# Patient Record
Sex: Male | Born: 1985 | Race: Black or African American | Hispanic: No | Marital: Married | Smoking: Light tobacco smoker
Health system: Southern US, Community
[De-identification: ages and names within clinical notes are randomized; demographics above are authoritative.]

## PROBLEM LIST (undated history)

## (undated) ENCOUNTER — Ambulatory Visit: Admission: EM | Disposition: A | Discharge status: Home / Self Care | Payer: BC Managed Care – PPO

## (undated) DIAGNOSIS — D869 Sarcoidosis, unspecified: Secondary | ICD-10-CM

## (undated) DIAGNOSIS — N200 Calculus of kidney: Secondary | ICD-10-CM

---
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
## 2004-08-29 ENCOUNTER — Emergency Department (HOSPITAL_COMMUNITY): Admission: EM | Admit: 2004-08-29 | Discharge: 2004-08-29 | Payer: Self-pay | Admitting: Emergency Medicine

## 2005-01-06 ENCOUNTER — Emergency Department (HOSPITAL_COMMUNITY): Admission: EM | Admit: 2005-01-06 | Discharge: 2005-01-06 | Payer: Self-pay | Admitting: Emergency Medicine

## 2005-02-21 ENCOUNTER — Emergency Department (HOSPITAL_COMMUNITY): Admission: EM | Admit: 2005-02-21 | Discharge: 2005-02-21 | Payer: Self-pay | Admitting: Emergency Medicine

## 2006-03-02 ENCOUNTER — Emergency Department (HOSPITAL_COMMUNITY): Admission: EM | Admit: 2006-03-02 | Discharge: 2006-03-02 | Payer: Self-pay | Admitting: Emergency Medicine

## 2006-05-19 ENCOUNTER — Emergency Department (HOSPITAL_COMMUNITY): Admission: EM | Admit: 2006-05-19 | Discharge: 2006-05-20 | Payer: Self-pay | Admitting: Family Medicine

## 2006-05-19 ENCOUNTER — Emergency Department (HOSPITAL_COMMUNITY): Admission: EM | Admit: 2006-05-19 | Discharge: 2006-05-19 | Payer: Self-pay | Admitting: Emergency Medicine

## 2006-08-11 ENCOUNTER — Emergency Department (HOSPITAL_COMMUNITY): Admission: EM | Admit: 2006-08-11 | Discharge: 2006-08-12 | Payer: Self-pay | Admitting: Emergency Medicine

## 2007-02-26 ENCOUNTER — Emergency Department (HOSPITAL_COMMUNITY): Admission: EM | Admit: 2007-02-26 | Discharge: 2007-02-26 | Payer: Self-pay | Admitting: Emergency Medicine

## 2007-11-21 ENCOUNTER — Emergency Department (HOSPITAL_COMMUNITY): Admission: EM | Admit: 2007-11-21 | Discharge: 2007-11-21 | Payer: Self-pay | Admitting: Emergency Medicine

## 2007-11-27 ENCOUNTER — Ambulatory Visit: Payer: Self-pay | Admitting: Pulmonary Disease

## 2007-11-27 DIAGNOSIS — G43909 Migraine, unspecified, not intractable, without status migrainosus: Secondary | ICD-10-CM | POA: Insufficient documentation

## 2007-11-27 DIAGNOSIS — J984 Other disorders of lung: Secondary | ICD-10-CM | POA: Insufficient documentation

## 2007-11-27 DIAGNOSIS — R599 Enlarged lymph nodes, unspecified: Secondary | ICD-10-CM | POA: Insufficient documentation

## 2007-11-27 LAB — CONVERTED CEMR LAB
Albumin: 3.4 g/dL — ABNORMAL LOW (ref 3.5–5.2)
Alkaline Phosphatase: 71 units/L (ref 39–117)
Bilirubin, Direct: 0.1 mg/dL (ref 0.0–0.3)
INR: 1 (ref 0.8–1.0)
LDH: 411 units/L — ABNORMAL HIGH (ref 94–250)
Prothrombin Time: 12 s (ref 10.9–13.3)
Rhuematoid fact SerPl-aCnc: <20 intl units/mL — ABNORMAL LOW (ref 0.0–20.0)
Total Protein: 7.9 g/dL (ref 6.0–8.3)
aPTT: 34.6 s — ABNORMAL HIGH (ref 21.7–29.8)

## 2007-12-04 ENCOUNTER — Ambulatory Visit: Admission: RE | Admit: 2007-12-04 | Discharge: 2007-12-04 | Payer: Self-pay | Admitting: Pulmonary Disease

## 2007-12-04 ENCOUNTER — Ambulatory Visit: Payer: Self-pay | Admitting: Pulmonary Disease

## 2007-12-04 ENCOUNTER — Encounter: Payer: Self-pay | Admitting: Pulmonary Disease

## 2007-12-10 ENCOUNTER — Ambulatory Visit: Payer: Self-pay | Admitting: Pulmonary Disease

## 2007-12-10 DIAGNOSIS — D869 Sarcoidosis, unspecified: Secondary | ICD-10-CM | POA: Insufficient documentation

## 2007-12-13 ENCOUNTER — Ambulatory Visit: Payer: Self-pay | Admitting: Pulmonary Disease

## 2007-12-14 ENCOUNTER — Ambulatory Visit: Payer: Self-pay | Admitting: Cardiology

## 2008-01-09 ENCOUNTER — Ambulatory Visit: Payer: Self-pay | Admitting: Pulmonary Disease

## 2008-03-11 ENCOUNTER — Ambulatory Visit: Payer: Self-pay | Admitting: Pulmonary Disease

## 2009-08-17 ENCOUNTER — Emergency Department (HOSPITAL_COMMUNITY): Admission: EM | Admit: 2009-08-17 | Discharge: 2009-08-17 | Payer: Self-pay | Admitting: Emergency Medicine

## 2010-08-09 ENCOUNTER — Emergency Department (HOSPITAL_COMMUNITY)
Admission: EM | Admit: 2010-08-09 | Discharge: 2010-08-09 | Payer: Self-pay | Source: Home / Self Care | Admitting: Emergency Medicine

## 2010-08-25 ENCOUNTER — Emergency Department (HOSPITAL_COMMUNITY)
Admission: EM | Admit: 2010-08-25 | Discharge: 2010-08-25 | Payer: Self-pay | Source: Home / Self Care | Admitting: Emergency Medicine

## 2010-08-26 ENCOUNTER — Emergency Department (HOSPITAL_COMMUNITY)
Admission: EM | Admit: 2010-08-26 | Discharge: 2010-08-26 | Payer: Self-pay | Source: Home / Self Care | Admitting: Emergency Medicine

## 2010-11-08 LAB — DIFFERENTIAL
Basophils Absolute: 0 10*3/uL (ref 0.0–0.1)
Eosinophils Relative: 0 % (ref 0–5)
Lymphocytes Relative: 8 % — ABNORMAL LOW (ref 12–46)
Lymphs Abs: 1.2 10*3/uL (ref 0.7–4.0)
Monocytes Absolute: 2 10*3/uL — ABNORMAL HIGH (ref 0.1–1.0)
Monocytes Relative: 14 % — ABNORMAL HIGH (ref 3–12)
Neutro Abs: 11.7 10*3/uL — ABNORMAL HIGH (ref 1.7–7.7)

## 2010-11-08 LAB — CBC
HCT: 45.3 % (ref 39.0–52.0)
Hemoglobin: 15.2 g/dL (ref 13.0–17.0)
MCV: 86 fL (ref 78.0–100.0)
RDW: 12.8 % (ref 11.5–15.5)
WBC: 15 10*3/uL — ABNORMAL HIGH (ref 4.0–10.5)

## 2010-11-08 LAB — STREP A DNA PROBE: Group A Strep Probe: NEGATIVE

## 2010-11-08 LAB — POCT I-STAT, CHEM 8
BUN: 8 mg/dL (ref 6–23)
Calcium, Ion: 1.02 mmol/L — ABNORMAL LOW (ref 1.12–1.32)
Chloride: 109 mEq/L (ref 96–112)
Creatinine, Ser: 1.2 mg/dL (ref 0.4–1.5)
Glucose, Bld: 95 mg/dL (ref 70–99)
TCO2: 23 mmol/L (ref 0–100)

## 2010-11-08 LAB — RAPID STREP SCREEN (MED CTR MEBANE ONLY): Streptococcus, Group A Screen (Direct): NEGATIVE

## 2011-01-11 NOTE — Op Note (Signed)
NAMEJAKOBIE, Steve Fuller             ACCOUNT NO.:  [id number]   MEDICAL RECORD NO.:  [id number]          PATIENT TYPE:  AMB   LOCATION:  CARD                         FACILITY:  [facility]   PHYSICIAN:  Oretha Milch, MD      DATE OF BIRTH:  07-02-1986   DATE OF PROCEDURE:  12/04/2007  DATE OF DISCHARGE:                               OPERATIVE REPORT   PROCEDURE PERFORMED:  Fluoroscopy.   INDICATIONS FOR PROCEDURE:  Mediastinal lymphadenopathy and pulmonary  nodules in this 25 year old smoker with normal ACE level and elevated  LDH.  The differential diagnosis being sarcoidosis or lymphoma.  The  risks of the procedure were discussed including that of coughing,  bleeding and a small chance of lung puncture requiring a chest tube were  discussed with the patient in great detail.  The relative yields of the  procedure and the possible need for a second procedure was also  discussed in the office in detail.   ANESTHESIA:  Versed 6 mg and Fentanyl 150 mcg were used in divided doses  during the procedure with blood pressure and cardiac monitoring.   DESCRIPTION OF PROCEDURE:  The bronchoscope was inserted from the right  nare.  Upper airway appeared normal.  Vocal cord showed normal  appearance and motion.  A tiny endobronchial nodule was noted in the  superior segment of the right lower lobe bronchus.  All other segmental  and subsegmental airways appeared intact.  The bronchial mucosa appeared  mildly inflamed.   Under fluoroscopy, transbronchial biopsies were obtained from the right  lower lobe. The nodule in the superior segment of the right lower lobe  was brushed.  Wang needle aspirate was obtained in the carina.  The  right upper lobe bronchus showed four subsegmental bronchi which could  be an anatomical variant.  Bronchoalveolar lavage was obtained from the  right lower lobe.   Patient tolerated the procedure well and was awake and interactive right  after the procedure.  A chest  x-ray will be performed to rule out  presence of pneumothorax.      Oretha Milch, MD  Electronically Signed     RVA/MEDQ  D:  12/04/2007  T:  12/04/2007  Job:  [id number]

## 2011-03-12 IMAGING — CT CT NECK W/ CM
3 series · 13 of 20 positions shown, 15 images · IV contrast (agent unspecified)
Comparison: None.

CLINICAL DATA: Throat pain for 2 weeks.

CT NECK WITH CONTRAST
TECHNIQUE: Multidetector CT imaging of the neck was performed with
intravenous contrast.
Contrast: 100 ml Rmnipaque-5CC.

[Series 2: neck rtn st · axial · 0.49mm/px · z∈[-296,-104]mm · 5 of 98 slices shown]
[im 17/98  bone]
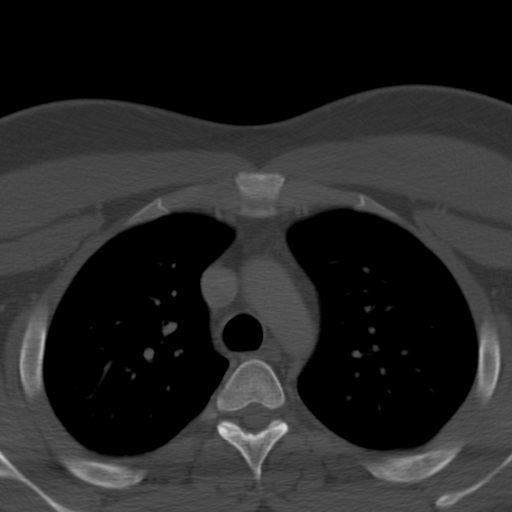
[im 33/98  bone]
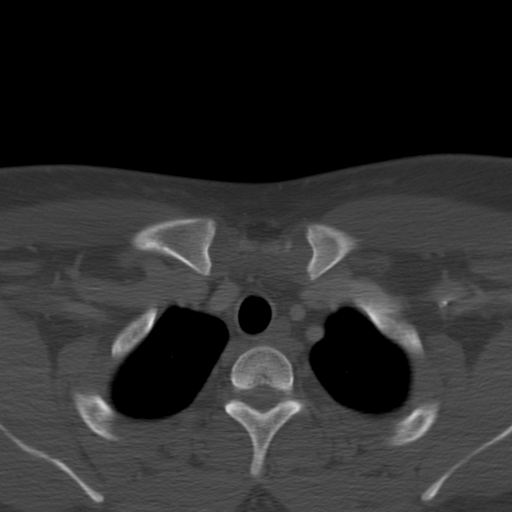
[im 49/98  bone]
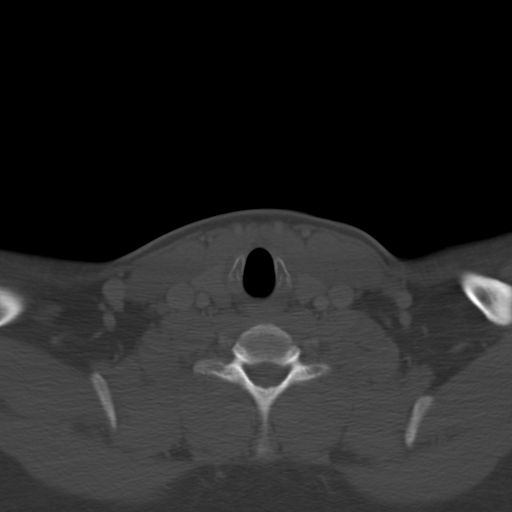
[im 65/98  bone]
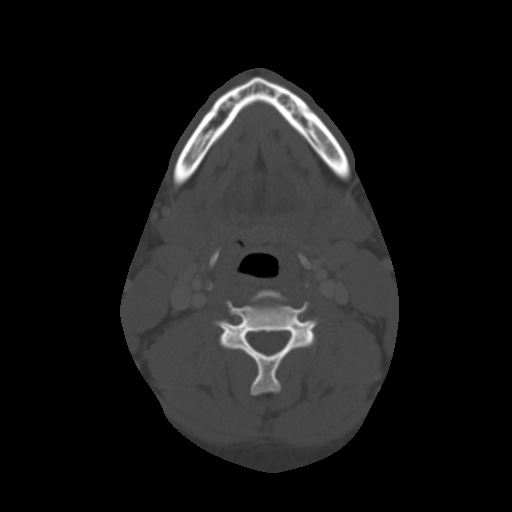
[im 81/98  bone]
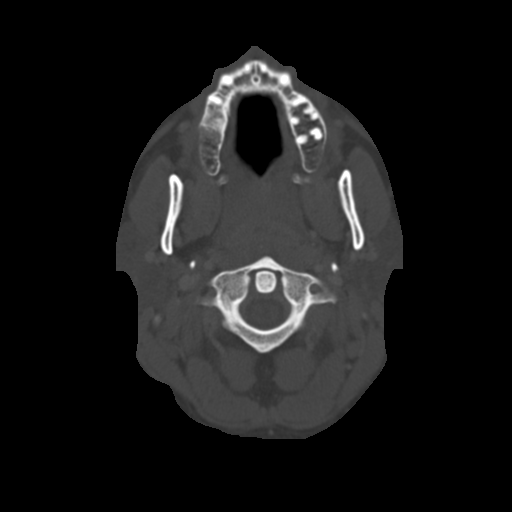

[Series 604: <mpr thick range(2)> · coronal · 0.58mm/px · 3 of 55 slices shown]
[im 11/55  bone]
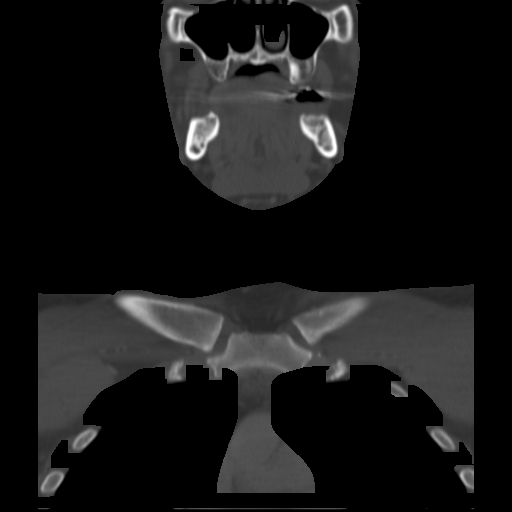
[im 22/55  bone]
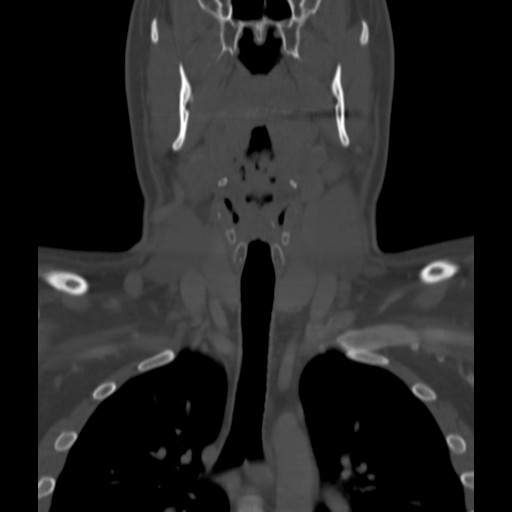
[im 33/55  bone]
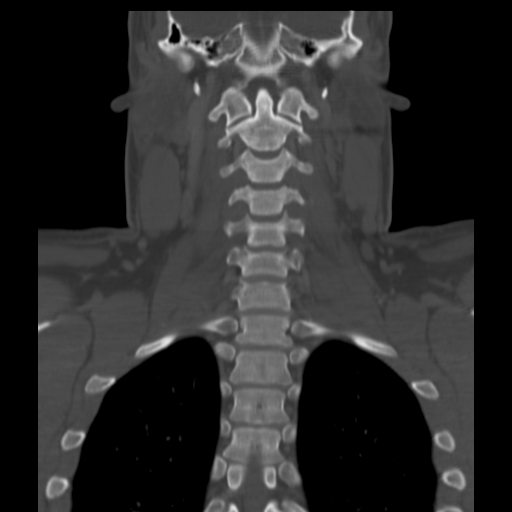

[Series 605: <mpr thick range(3)> · axial · 0.58mm/px · z∈[-337,-131]mm · 5 of 105 slices shown, 7 images]
[im 18/105  soft-tissue]
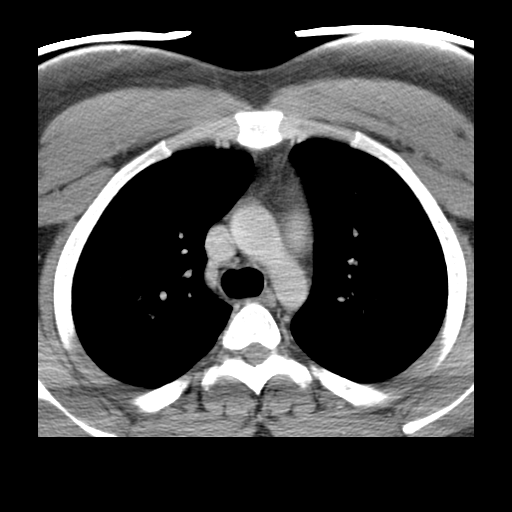
[im 18/105  bone]
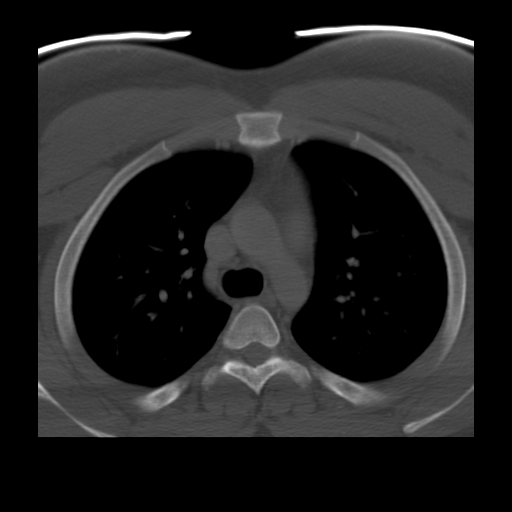
[im 35/105  bone]
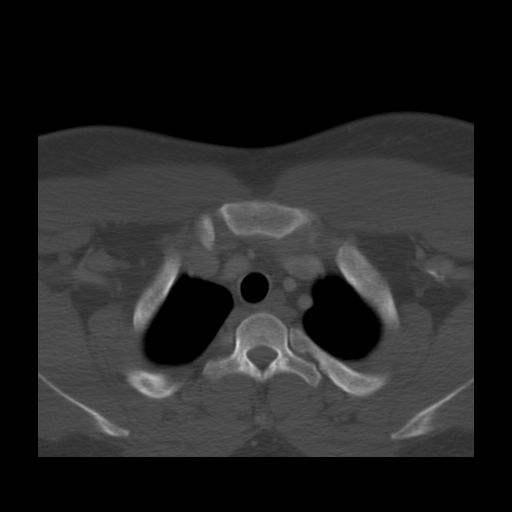
[im 53/105  bone]
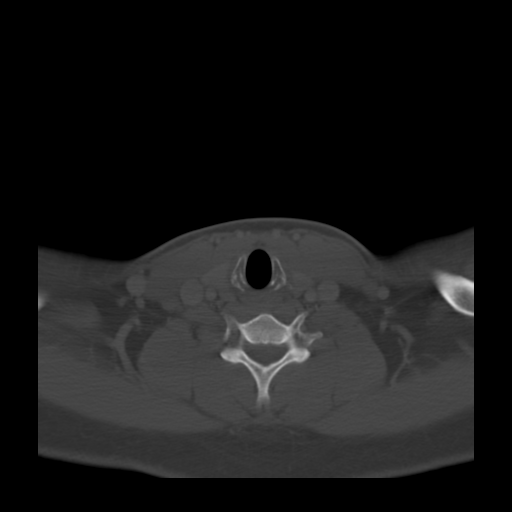
[im 70/105  bone]
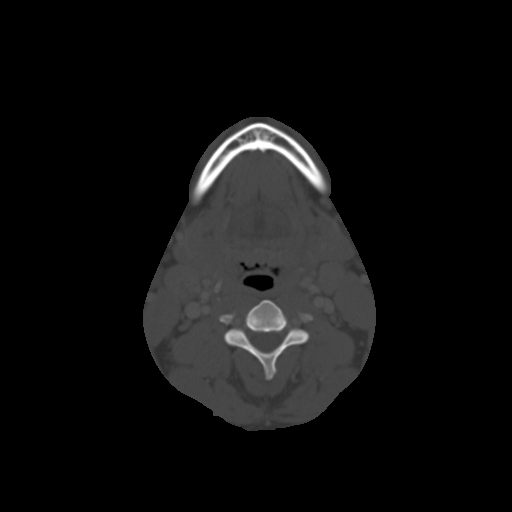
[im 87/105  soft-tissue]
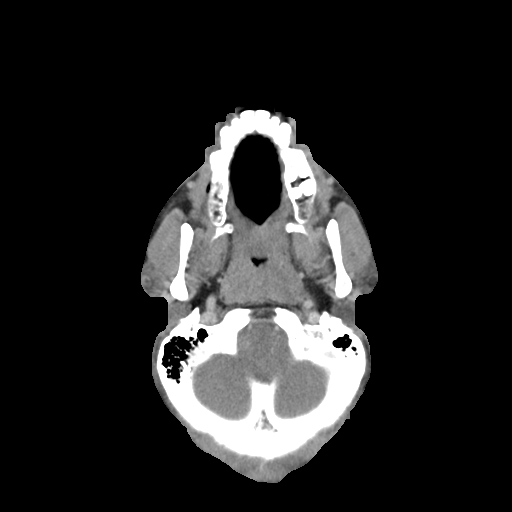
[im 87/105  bone]
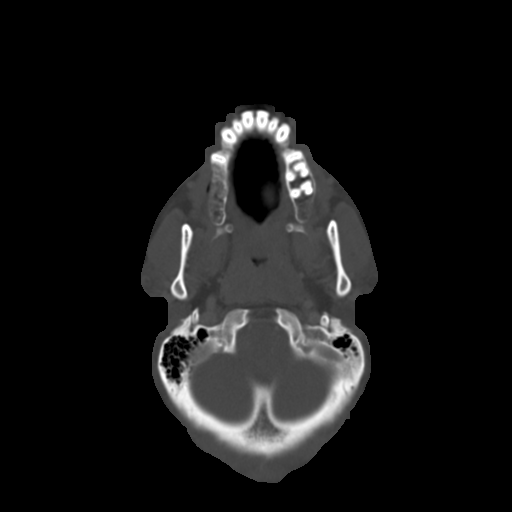

[13 of 20 positions shown; findings below may reference images not displayed]

FINDINGS: Diffuse tonsillar hypertrophy throughout Waldeyers ring.
Within the enlarged palatine tonsils, there are low density areas
suggestive of small abscesses.  On the right abscess measures up to
11.8 x 6.4 x 8.7 mm.  On the left there are a few small abscesses
side-by-side measuring up to 20.1 x 11.8 x 10.6 mm.  Presently, no
breakthrough into the parapharyngeal space.

Adenopathy most prominent level II region measuring 2.4 x 2 x
cm on the right and 2 x 1.3 x 3.8 cm on the left.

Cerebellar tonsils minimally low-lying but within range normal
limits.  Congenitally narrowed cervical canal.  Exophthalmos.
IMPRESSION: Diffuse tonsillar hypertrophy throughout Waldeyers ring.  Within
the enlarged palatine tonsils, there are low density areas
suggestive of small abscesses.  Presently, no breakthrough into the
parapharyngeal space.

Level II adenopathy as described above.

Critical test results telephoned to Dr.  Ozoria at the time of

## 2011-05-23 LAB — CBC
HCT: 42.2
Hemoglobin: 14.4
MCHC: 34.1
MCV: 83.9
Platelets: 382
RBC: 5.02
RDW: 12.5
WBC: 10.3

## 2011-05-23 LAB — BASIC METABOLIC PANEL
BUN: 5 — ABNORMAL LOW
CO2: 26
Calcium: 9.1
Chloride: 102
Creatinine, Ser: 0.98
GFR calc Af Amer: >60
GFR calc non Af Amer: >60
Glucose, Bld: 78
Potassium: 3.9
Sodium: 136

## 2011-05-23 LAB — DIFFERENTIAL
Eosinophils Relative: 1
Lymphocytes Relative: 15
Lymphs Abs: 1.5
Monocytes Relative: 13 — ABNORMAL HIGH
Neutrophils Relative %: 70

## 2011-05-24 LAB — CULTURE, RESPIRATORY W GRAM STAIN: Culture: NORMAL

## 2011-05-24 LAB — AFB CULTURE WITH SMEAR (NOT AT ARMC)

## 2011-05-24 LAB — FUNGUS CULTURE W SMEAR: Fungal Smear: NONE SEEN

## 2012-10-22 ENCOUNTER — Encounter (HOSPITAL_COMMUNITY): Payer: Self-pay | Admitting: Emergency Medicine

## 2012-10-22 ENCOUNTER — Emergency Department (HOSPITAL_COMMUNITY)
Admission: EM | Admit: 2012-10-22 | Discharge: 2012-10-22 | Disposition: A | Discharge status: Home / Self Care | Payer: Self-pay | Attending: Emergency Medicine | Admitting: Emergency Medicine

## 2012-10-22 DIAGNOSIS — Z8619 Personal history of other infectious and parasitic diseases: Secondary | ICD-10-CM | POA: Insufficient documentation

## 2012-10-22 DIAGNOSIS — Z79899 Other long term (current) drug therapy: Secondary | ICD-10-CM | POA: Insufficient documentation

## 2012-10-22 DIAGNOSIS — F17200 Nicotine dependence, unspecified, uncomplicated: Secondary | ICD-10-CM | POA: Insufficient documentation

## 2012-10-22 DIAGNOSIS — R509 Fever, unspecified: Secondary | ICD-10-CM | POA: Insufficient documentation

## 2012-10-22 DIAGNOSIS — J358 Other chronic diseases of tonsils and adenoids: Secondary | ICD-10-CM | POA: Insufficient documentation

## 2012-10-22 DIAGNOSIS — J039 Acute tonsillitis, unspecified: Secondary | ICD-10-CM

## 2012-10-22 HISTORY — DX: Sarcoidosis, unspecified: D86.9

## 2012-10-22 MED ORDER — PREDNISONE 20 MG PO TABS: ORAL_TABLET | ORAL | Status: DC

## 2012-10-22 MED ORDER — CEPHALEXIN 500 MG PO CAPS: ORAL_CAPSULE | ORAL | Status: DC

## 2012-10-22 MED ORDER — OXYCODONE-ACETAMINOPHEN 5-325 MG PO TABS: 2.0000 | ORAL_TABLET | ORAL | Status: DC | PRN

## 2012-10-22 NOTE — ED Notes (Signed)
Pt here for sore throat sore x3 days with fever of 1o1 with increase pain today

## 2012-10-22 NOTE — ED Provider Notes (Signed)
History     CSN: [id number]  Arrival date & time 10/22/12  0825   First MD Initiated Contact with Patient 10/22/12 [phone]      Chief Complaint  Patient presents with  . Sore Throat    (Consider location/radiation/quality/duration/timing/severity/associated sxs/prior treatment) HPI This 27 year old male is 3 days of sore throat with fever with no other symptoms, he has no drooling no stridor no trismus, has no nasal congestion no cough no chest pain no shortness breath no abdominal pain no vomiting no diarrhea no rash no confusion. He is able to take oral fluids. There is no treatment prior to arrival other than over-the-counter analgesics with minimal improvement. Past Medical History  Diagnosis Date  . Sarcoidosis     History reviewed. No pertinent past surgical history.  History reviewed. No pertinent family history.  History  Substance Use Topics  . Smoking status: Light Tobacco Smoker -- 0.50 packs/day  . Smokeless tobacco: Not on file  . Alcohol Use: No      Review of Systems 10 Systems reviewed and are negative for acute change except as noted in the HPI. Allergies  Penicillins  Home Medications   Current Outpatient Rx  Name  Route  Sig  Dispense  Refill  . acetaminophen (TYLENOL) 500 MG tablet   Oral   Take 500 mg by mouth every 6 (six) hours as needed for pain.         Marland Kitchen ibuprofen (ADVIL,MOTRIN) 200 MG tablet   Oral   Take 200 mg by mouth every 6 (six) hours as needed for pain.         . cephALEXin (KEFLEX) 500 MG capsule      2 caps po bid x 7 days   28 capsule   0   . oxyCODONE-acetaminophen (PERCOCET) 5-325 MG per tablet   Oral   Take 2 tablets by mouth every 4 (four) hours as needed for pain.   20 tablet   0   . predniSONE (DELTASONE) 20 MG tablet      3 tabs po daily x 2 days   6 tablet   0     BP 128/82  Pulse 98  Temp(Src) 99.5 F (37.5 C) (Oral)  Resp 18  SpO2 98%  Physical Exam  Nursing note and vitals  reviewed. Constitutional:  Awake, alert, nontoxic appearance.  HENT:  Head: Atraumatic.  Mouth/Throat: Oropharyngeal exudate present.  Bilateral tonsillar exudates with erythema and swelling with midline uvula without trismus without suspicion of peritonsillar abscess or airway compromise  Eyes: Right eye exhibits no discharge. Left eye exhibits no discharge.  Neck: Neck supple.  Cardiovascular: Normal rate and regular rhythm.   No murmur heard. Pulmonary/Chest: Effort normal and breath sounds normal. No stridor. No respiratory distress. He has no wheezes. He has no rales. He exhibits no tenderness.  Abdominal: Soft. Bowel sounds are normal. He exhibits no distension. There is no tenderness. There is no rebound and no guarding.  No hepatosplenomegaly appreciated  Musculoskeletal: He exhibits no edema and no tenderness.  Baseline ROM, no obvious new focal weakness.  Lymphadenopathy:    He has cervical adenopathy.  Neurological: He is alert.  Mental status and motor strength appears baseline for patient and situation.  Skin: No rash noted.  Psychiatric: He has a normal mood and affect.    ED Course  Procedures (including critical care time)  Labs Reviewed - No data to display No results found.   1. Tonsillitis with exudate  MDM   Pt stable in ED with no significant deterioration in condition.  Patient / Family / Caregiver informed of clinical course, understand medical decision-making process, and agree with plan.  I doubt any other EMC precluding discharge at this time including, but not necessarily limited to the following:PTA.       Hurman Horn, MD 10/29/12 [phone]

## 2013-07-29 ENCOUNTER — Emergency Department (HOSPITAL_COMMUNITY)
Admission: EM | Admit: 2013-07-29 | Discharge: 2013-07-29 | Disposition: A | Discharge status: Home / Self Care | Payer: Self-pay | Attending: Emergency Medicine | Admitting: Emergency Medicine

## 2013-07-29 ENCOUNTER — Encounter (HOSPITAL_COMMUNITY): Payer: Self-pay | Admitting: Emergency Medicine

## 2013-07-29 DIAGNOSIS — F17200 Nicotine dependence, unspecified, uncomplicated: Secondary | ICD-10-CM | POA: Insufficient documentation

## 2013-07-29 DIAGNOSIS — J039 Acute tonsillitis, unspecified: Secondary | ICD-10-CM | POA: Insufficient documentation

## 2013-07-29 DIAGNOSIS — Z8619 Personal history of other infectious and parasitic diseases: Secondary | ICD-10-CM | POA: Insufficient documentation

## 2013-07-29 DIAGNOSIS — Z79899 Other long term (current) drug therapy: Secondary | ICD-10-CM | POA: Insufficient documentation

## 2013-07-29 MED ORDER — AZITHROMYCIN 250 MG PO TABS: 500.0000 mg | ORAL_TABLET | Freq: Every day | ORAL | Status: DC

## 2013-07-29 MED ORDER — PREDNISONE 20 MG PO TABS: 40.0000 mg | ORAL_TABLET | Freq: Every day | ORAL | Status: DC

## 2013-07-29 NOTE — ED Notes (Signed)
Pt states he has swelling in his throat from tonsils. Pt states he has hx of tonsillitis and needs tonsils out.

## 2013-07-29 NOTE — ED Notes (Signed)
Md at bedside

## 2013-07-29 NOTE — ED Notes (Signed)
Pt states he knows he was supposed to have his tonsils taken out 4 or 5 years ago, but he hasn't.  Pt presents today with c/o neck swelling similar to when he has a flare up with his tonsils.

## 2013-07-29 NOTE — ED Provider Notes (Signed)
CSN: [id number]     Arrival date & time 07/29/13  1543 History   First MD Initiated Contact with Patient 07/29/13 1945     Chief Complaint  Patient presents with  . Sore Throat   (Consider location/radiation/quality/duration/timing/severity/associated sxs/prior Treatment) HPI Comments: Patient comes to the ER for evaluation of sore throat. Patient reports that he has a history of recurrent tonsillitis, was told that he needs his tonsils out. He gets frequent episodes of tonsillitis. Last night he started having pain in his throat with swallowing and this has progressed. He has not had any fever. There is no trouble swallowing or trouble breathing. No nausea, vomiting, diarrhea. He has swelling of his glands in his neck.  Patient is a 27 y.o. male presenting with pharyngitis.  Sore Throat    Past Medical History  Diagnosis Date  . Sarcoidosis    History reviewed. No pertinent past surgical history. No family history on file. History  Substance Use Topics  . Smoking status: Light Tobacco Smoker -- 0.50 packs/day  . Smokeless tobacco: Not on file  . Alcohol Use: No    Review of Systems  HENT: Positive for sore throat.   Skin: Negative for rash.    Allergies  Amoxicillin and Food  Home Medications   Current Outpatient Rx  Name  Route  Sig  Dispense  Refill  . guaiFENesin (MUCINEX) 600 MG 12 hr tablet   Oral   Take 600 mg by mouth 2 (two) times daily.         . Multiple Vitamin (MULTIVITAMIN WITH MINERALS) TABS tablet   Oral   Take 1 tablet by mouth daily.          BP 146/89  Pulse 84  Temp(Src) 97.7 F (36.5 C) (Oral)  Resp 20  Ht 6\' 2"  (1.88 m)  Wt 255 lb (115.667 kg)  BMI 32.73 kg/m2  SpO2 100% Physical Exam  Constitutional: He is oriented to person, place, and time. He appears well-developed and well-nourished. No distress.  HENT:  Head: Normocephalic and atraumatic.  Right Ear: Hearing normal.  Left Ear: Hearing normal.  Nose: Nose normal.   Mouth/Throat: Mucous membranes are normal. Posterior oropharyngeal erythema present. No tonsillar abscesses.  Eyes: Conjunctivae and EOM are normal. Pupils are equal, round, and reactive to light.  Neck: Normal range of motion. Neck supple.  Cardiovascular: Regular rhythm, S1 normal and S2 normal.  Exam reveals no gallop and no friction rub.   No murmur heard. Pulmonary/Chest: Effort normal and breath sounds normal. No respiratory distress. He exhibits no tenderness.  Abdominal: Soft. Normal appearance and bowel sounds are normal. There is no hepatosplenomegaly. There is no tenderness. There is no rebound, no guarding, no tenderness at McBurney's point and negative Murphy's sign. No hernia.  Musculoskeletal: Normal range of motion.  Lymphadenopathy:    He has cervical adenopathy.       Right cervical: Superficial cervical adenopathy present.  Neurological: He is alert and oriented to person, place, and time. He has normal strength. No cranial nerve deficit or sensory deficit. Coordination normal. GCS eye subscore is 4. GCS verbal subscore is 5. GCS motor subscore is 6.  Skin: Skin is warm, dry and intact. No rash noted. No cyanosis.  Psychiatric: He has a normal mood and affect. His speech is normal and behavior is normal. Thought content normal.    ED Course  Procedures (including critical care time) Labs Review Labs Reviewed - No data to display Imaging Review No results found.  EKG Interpretation   None       MDM  Diagnosis: Pharyngitis/tonsillitis  Presents to the ER with recurrent pharyngitis/tonsillitis. Treat Empirically.    Gilda Crease, MD 07/29/13 2018

## 2019-06-22 ENCOUNTER — Emergency Department (HOSPITAL_COMMUNITY): Payer: Self-pay

## 2019-06-22 ENCOUNTER — Observation Stay (HOSPITAL_COMMUNITY): Payer: Self-pay

## 2019-06-22 ENCOUNTER — Other Ambulatory Visit: Payer: Self-pay

## 2019-06-22 ENCOUNTER — Inpatient Hospital Stay (HOSPITAL_COMMUNITY)
Admission: EM | Admit: 2019-06-22 | Discharge: 2019-06-24 | DRG: 885 | Disposition: A | Discharge status: Home / Self Care | Payer: Self-pay | Attending: Family Medicine | Admitting: Family Medicine

## 2019-06-22 DIAGNOSIS — R451 Restlessness and agitation: Secondary | ICD-10-CM | POA: Diagnosis present

## 2019-06-22 DIAGNOSIS — Z79899 Other long term (current) drug therapy: Secondary | ICD-10-CM

## 2019-06-22 DIAGNOSIS — Z881 Allergy status to other antibiotic agents status: Secondary | ICD-10-CM

## 2019-06-22 DIAGNOSIS — F309 Manic episode, unspecified: Secondary | ICD-10-CM | POA: Diagnosis present

## 2019-06-22 DIAGNOSIS — Z7952 Long term (current) use of systemic steroids: Secondary | ICD-10-CM

## 2019-06-22 DIAGNOSIS — G43909 Migraine, unspecified, not intractable, without status migrainosus: Secondary | ICD-10-CM | POA: Diagnosis present

## 2019-06-22 DIAGNOSIS — Z20828 Contact with and (suspected) exposure to other viral communicable diseases: Secondary | ICD-10-CM | POA: Diagnosis present

## 2019-06-22 DIAGNOSIS — R410 Disorientation, unspecified: Secondary | ICD-10-CM

## 2019-06-22 DIAGNOSIS — F17200 Nicotine dependence, unspecified, uncomplicated: Secondary | ICD-10-CM | POA: Diagnosis present

## 2019-06-22 DIAGNOSIS — F3113 Bipolar disorder, current episode manic without psychotic features, severe: Secondary | ICD-10-CM

## 2019-06-22 DIAGNOSIS — F22 Delusional disorders: Secondary | ICD-10-CM | POA: Diagnosis present

## 2019-06-22 DIAGNOSIS — F319 Bipolar disorder, unspecified: Principal | ICD-10-CM | POA: Diagnosis present

## 2019-06-22 LAB — POCT I-STAT EG7
Acid-Base Excess: 3 mmol/L — ABNORMAL HIGH (ref 0.0–2.0)
Bicarbonate: 26.4 mmol/L (ref 20.0–28.0)
Calcium, Ion: 1.09 mmol/L — ABNORMAL LOW (ref 1.15–1.40)
HCT: 45 % (ref 39.0–52.0)
Hemoglobin: 15.3 g/dL (ref 13.0–17.0)
O2 Saturation: 99 %
Potassium: 4.1 mmol/L (ref 3.5–5.1)
Sodium: 140 mmol/L (ref 135–145)
TCO2: 28 mmol/L (ref 22–32)
pCO2, Ven: 37.1 mmHg — ABNORMAL LOW (ref 44.0–60.0)
pH, Ven: 7.461 — ABNORMAL HIGH (ref 7.250–7.430)
pO2, Ven: 131 mmHg — ABNORMAL HIGH (ref 32.0–45.0)

## 2019-06-22 LAB — CBC WITH DIFFERENTIAL/PLATELET
Abs Immature Granulocytes: 0.03 10*3/uL (ref 0.00–0.07)
Basophils Absolute: 0.1 10*3/uL (ref 0.0–0.1)
Basophils Relative: 1 %
Eosinophils Absolute: 0 10*3/uL (ref 0.0–0.5)
Eosinophils Relative: 0 %
HCT: 43.7 % (ref 39.0–52.0)
Hemoglobin: 13.9 g/dL (ref 13.0–17.0)
Immature Granulocytes: 0 %
Lymphocytes Relative: 15 %
Lymphs Abs: 1.4 10*3/uL (ref 0.7–4.0)
MCH: 28 pg (ref 26.0–34.0)
MCHC: 31.8 g/dL (ref 30.0–36.0)
MCV: 87.9 fL (ref 80.0–100.0)
Monocytes Absolute: 0.9 10*3/uL (ref 0.1–1.0)
Monocytes Relative: 9 %
Neutro Abs: 7.1 10*3/uL (ref 1.7–7.7)
Neutrophils Relative %: 75 %
Platelets: 223 10*3/uL (ref 150–400)
RBC: 4.97 MIL/uL (ref 4.22–5.81)
RDW: 13.1 % (ref 11.5–15.5)
WBC: 9.4 10*3/uL (ref 4.0–10.5)
nRBC: 0 % (ref 0.0–0.2)

## 2019-06-22 LAB — URINALYSIS, ROUTINE W REFLEX MICROSCOPIC
Bilirubin Urine: NEGATIVE
Glucose, UA: NEGATIVE mg/dL
Hgb urine dipstick: NEGATIVE
Ketones, ur: 20 mg/dL — AB
Leukocytes,Ua: NEGATIVE
Nitrite: NEGATIVE
Protein, ur: 100 mg/dL — AB
Specific Gravity, Urine: 1.031 — ABNORMAL HIGH (ref 1.005–1.030)
pH: 5 (ref 5.0–8.0)

## 2019-06-22 LAB — COMPREHENSIVE METABOLIC PANEL
ALT: 26 U/L (ref 0–44)
AST: 27 U/L (ref 15–41)
Albumin: 4.3 g/dL (ref 3.5–5.0)
Alkaline Phosphatase: 60 U/L (ref 38–126)
Anion gap: 13 (ref 5–15)
BUN: 8 mg/dL (ref 6–20)
CO2: 22 mmol/L (ref 22–32)
Calcium: 9.3 mg/dL (ref 8.9–10.3)
Chloride: 105 mmol/L (ref 98–111)
Creatinine, Ser: 1.17 mg/dL (ref 0.61–1.24)
GFR calc Af Amer: >60 mL/min (ref >60)
GFR calc non Af Amer: >60 mL/min (ref >60)
Glucose, Bld: 133 mg/dL — ABNORMAL HIGH (ref 70–99)
Potassium: 4.2 mmol/L (ref 3.5–5.1)
Sodium: 140 mmol/L (ref 135–145)
Total Bilirubin: 1.2 mg/dL (ref 0.3–1.2)
Total Protein: 7.4 g/dL (ref 6.5–8.1)

## 2019-06-22 LAB — RAPID URINE DRUG SCREEN, HOSP PERFORMED
Amphetamines: NOT DETECTED
Barbiturates: NOT DETECTED
Benzodiazepines: POSITIVE — AB
Cocaine: NOT DETECTED
Opiates: NOT DETECTED
Tetrahydrocannabinol: POSITIVE — AB

## 2019-06-22 LAB — LACTIC ACID, PLASMA: Lactic Acid, Venous: 1.2 mmol/L (ref 0.5–1.9)

## 2019-06-22 LAB — ETHANOL: Alcohol, Ethyl (B): <10 mg/dL (ref <10)

## 2019-06-22 LAB — CBG MONITORING, ED: Glucose-Capillary: 141 mg/dL — ABNORMAL HIGH (ref 70–99)

## 2019-06-22 MED ORDER — HALOPERIDOL LACTATE 5 MG/ML IJ SOLN
5.0000 mg | Freq: Once | INTRAMUSCULAR | Status: AC
  Administered 2019-06-22: 5 mg via INTRAVENOUS
  Filled 2019-06-22: qty 1

## 2019-06-22 MED ORDER — LORAZEPAM 2 MG/ML IJ SOLN
INTRAMUSCULAR | Status: AC
  Administered 2019-06-22: 2 mg
  Filled 2019-06-22: qty 1

## 2019-06-22 NOTE — ED Notes (Signed)
Patient transported to CT 

## 2019-06-22 NOTE — H&P (Signed)
[facility] Admission History and Physical Service Pager: [phone]  Patient name: Steve Fuller Medical record number: [id number] Date of birth: [birth year] Age: 33 y.o. Gender: male  Primary Care Provider: Patient, No Pcp Per Consultants: Psych Code Status: Full  Chief Complaint: Manic episode  Assessment and Plan: Steve Fuller is a 33 y.o. male presenting with new onset episode of acute mania in previously undiagnosed patient with bipolar disorder.Marland Kitchen PMH is significant for migraines  Acute manic episode-patient has had approximately 1.5 to 2 weeks of increasing signs of mania including decreased sleep, delusions of grandeur, paranoid delusions, pressured speech, tangentiality.  Patient is never been diagnosed with psychiatric disorder prior to this and no known family history of psychiatric disorder.  Wife states he has a medical marijuana card for migraines, which he uses, but does not do other drugs nor does he drink alcohol.  Patient apparently had an unresponsive episode prior to arriving at hospital for which she was given approximately 30 chest compressions, but it appears he never lost a pulse.  Patient now mildly tachycardic, but otherwise vital signs stable and on room air.  Patient is awake and able to converse, but is noncompliant with questioning.  History was obtained mostly by family.  UDS is still pending, but lab work appears otherwise normal.  It appears he is having a manic episode with psychotic features.  He was given 5 mg IV Haldol and 2 mg IV Ativan in the ED for agitation and has subsequently become less agitated.  Will admit for observation with psych consult for psychiatric hospitalization placement and initiation of pharmacologic treatment. -Admit to observation, MedSurg, Dr. Andria Frames attending -Consult to psychiatry for evaluation and placement -Safety sitter ordered -If patient becomes agitated during the night, can give Haldol as needed.   No order placed currently. -Follow-up UDS: -A.m. labs BMP, CBC, TSH, -Vitals per routine -In and out -Up with assistance -Regular diet  Migraines-unable to get thorough history from patient due to his mania, but wife states he gets medical marijuana for this as well as chronic shoulder pain. -Tylenol as needed  S/p root canal-why states he had root canal performed earlier this month and is currently taking antibiotic, but she does not know the dose, duration, or name of the antibiotic.  Patient allergic to amoxicillin.  Asked wife to try to find out this medication. -Restart medication if wife is able to figure out which medication it is.  FEN/GI: Regular diet Prophylaxis: Lovenox  Disposition: MedSurg  History of Present Illness:  Steve Fuller is a 33 y.o. male presenting with approximately 1.5 to 2 weeks of increasing agitation and other manic symptoms.  Patient was living with his wife in [place], when she noticed that he was becoming more agitated, talking more frequently and for longer periods of time at a faster rate of speech.  She  stated he was becoming more tangential and was having delusions such as talking to God.  Also requiring less sleep, going to bed at 5: to 6 in the morning and then waking up a few hours later.  She states he took the children, ages 107 and 31, a few days ago down to [place] without her to stay with his parents.  According to EMS and the emergency provider, patient had an unresponsive episode for which they did 30 chest compressions, but appears patient never lost his pulse.  Patient was given naloxone in the ED which did not have any effect.  He did become  more responsive in the ED after getting Ativan and Haldol.  When I spoke with the patient he was able to answer where he was, but refused to answer the year or month and then refused to answer any more for questions.  Patient would repeatedly say that we do not believe him.   Review Of Systems: Per  HPI with the following additions:    Review of Systems  Unable to perform ROS: Mental acuity    Patient Active Problem List   Diagnosis Date Noted  . SARCOIDOSIS 12/10/2007  . MIGRAINE, CHRONIC 11/27/2007  . PULMONARY NODULE 11/27/2007  . LYMPHADENOPATHY 11/27/2007    Past Medical History: Past Medical History:  Diagnosis Date  . Sarcoidosis     Past Surgical History: No past surgical history on file.  Social History: Social History   Tobacco Use  . Smoking status: Light Tobacco Smoker    Packs/day: 0.50  Substance Use Topics  . Alcohol use: No  . Drug use: No   Additional social history: Does not drink alcohol, smokes 6-8 "[employer]" cigarillos.  Has medical marijuana card. Please also refer to relevant sections of EMR.  Family History: No family history on file. No family history of bipolar or other mental illness.  Allergies and Medications: Allergies  Allergen Reactions  . Amoxicillin Swelling  . Food Hives, Itching and Rash    Mango     No current facility-administered medications on file prior to encounter.    Current Outpatient Medications on File Prior to Encounter  Medication Sig Dispense Refill  . azithromycin (ZITHROMAX Z-PAK) 250 MG tablet Take 2 tablets (500 mg total) by mouth daily. 10 tablet 0  . guaiFENesin (MUCINEX) 600 MG 12 hr tablet Take 600 mg by mouth 2 (two) times daily.    . Multiple Vitamin (MULTIVITAMIN WITH MINERALS) TABS tablet Take 1 tablet by mouth daily.    . predniSONE (DELTASONE) 20 MG tablet Take 2 tablets (40 mg total) by mouth daily with breakfast. 6 tablet 0    Objective: BP (!) 155/93   Pulse 89   Resp 15   SpO2 97%  Exam: General: Alert, oriented to place and name.  Able to converse, but is unwilling to answer questions.  No acute distress. Eyes: PERRLA.  No scleral icterus ENTM: Moist oral mucosa.  No erythema.  No exudates Neck: No thyromegaly, no lymphadenopathy Cardiovascular: Regular rate and rhythm.  No  murmur.  2+ dorsalis pedis pulse bilaterally.  No bilateral pedal edema Respiratory: Lungs clear to auscultation bilaterally.  No wheezes, no crackles. Gastrointestinal: Soft, nontender.  Normal bowel sounds MSK: Moves all 4 extremities spontaneously. Derm: No rashes Neuro: Cranial nerves grossly intact Psych: Patient is tearful.  Patient makes paranoid remarks regarding people not trusting him or believing him.  States that he actually is talking to God.  Labs and Imaging: CBC BMET  Recent Labs  Lab 06/22/19 1510 06/22/19 1519  WBC 9.4  --   HGB 13.9 15.3  HCT 43.7 45.0  PLT 223  --    Recent Labs  Lab 06/22/19 1508 06/22/19 1519  NA 140 140  K 4.2 4.1  CL 105  --   CO2 22  --   BUN 8  --   CREATININE 1.17  --   GLUCOSE 133*  --   CALCIUM 9.3  --      CXR: Negative  Sandre Kitty, MD 06/22/2019, 7:51 PM PGY-2, [facility] FPTS Intern pager: [phone], text pages welcome

## 2019-06-22 NOTE — ED Notes (Signed)
Pt attempted to refuse covid swab. Pt educated about IVC status and need for swab. With pt wife assisting this nurse able to get covid swab completed. Pt continues argue with staff regarding care.

## 2019-06-22 NOTE — ED Provider Notes (Signed)
[facility] Provider Note   CSN: [id number] Arrival date & time: 06/22/19  1436     History   Chief Complaint Chief Complaint  Patient presents with   Drug Overdose   Altered Mental Status    HPI Steve Fuller is a 33 y.o. male.     The history is provided by the patient, a relative and the police.  Drug Overdose This is a new problem. The current episode started less than 1 hour ago. The problem occurs constantly. The problem has been gradually improving. Pertinent negatives include no chest pain, no abdominal pain and no shortness of breath. Associated symptoms comments: Patient has been having hyperreligiosity, paranoid behavior, and what sounds to be hallucinations according to the patient as well as his brother recently.  No previous history of the same.  He was unresponsive prior to arrival and received 30 chest compressions by police prior to arrival due to concern for not immediately palpable pulse in the setting of an unresponsive individual.  He also received 2 mg of naloxone prior to arrival without improvement.. Exacerbated by: Unclear exacerbating or alleviating factors at this time. Nothing relieves the symptoms. Treatments tried: Naloxone 2 mg intranasally was administered prior to arrival without improvement.    Past Medical History:  Diagnosis Date   Sarcoidosis     Patient Active Problem List   Diagnosis Date Noted   Manic episode (HCC) 06/22/2019   Migraine without status migrainosus, not intractable    SARCOIDOSIS 12/10/2007   MIGRAINE, CHRONIC 11/27/2007   PULMONARY NODULE 11/27/2007   LYMPHADENOPATHY 11/27/2007    No past surgical history on file.      Home Medications    Prior to Admission medications   Medication Sig Start Date End Date Taking? Authorizing Provider  clindamycin (CLEOCIN) 300 MG capsule Take 300 mg by mouth every 6 (six) hours. 06/12/19  Yes [provider]  Multiple  Vitamin (MULTIVITAMIN WITH MINERALS) TABS tablet Take 1 tablet by mouth daily.   Yes [provider]  azithromycin (ZITHROMAX Z-PAK) 250 MG tablet Take 2 tablets (500 mg total) by mouth daily. Patient not taking: Reported on 06/22/2019 07/29/13   Gilda CreasePollina, Emerick Weatherly J, MD  guaiFENesin (MUCINEX) 600 MG 12 hr tablet Take 600 mg by mouth 2 (two) times daily.    [provider]  predniSONE (DELTASONE) 20 MG tablet Take 2 tablets (40 mg total) by mouth daily with breakfast. Patient not taking: Reported on 06/22/2019 07/29/13   Gilda CreasePollina, Kelvin Burpee J, MD    Family History No family history on file.  Social History Social History   Tobacco Use   Smoking status: Light Tobacco Smoker    Packs/day: 0.50  Substance Use Topics   Alcohol use: No   Drug use: No     Allergies   Penicillins, Amoxicillin, and Food   Review of Systems Review of Systems  Constitutional: Negative for chills and fever.  HENT: Negative for ear pain and sore throat.   Eyes: Negative for pain and visual disturbance.  Respiratory: Negative for cough and shortness of breath.   Cardiovascular: Negative for chest pain and palpitations.  Gastrointestinal: Negative for abdominal pain and vomiting.  Genitourinary: Negative for dysuria and hematuria.  Musculoskeletal: Negative for arthralgias and back pain.  Skin: Negative for color change and rash.  Neurological: Negative for seizures and syncope.  Psychiatric/Behavioral: Positive for agitation, behavioral problems, confusion and hallucinations.  All other systems reviewed and are negative.    Physical Exam  Updated Vital Signs BP (!) 186/113 (BP Location: Left Leg)    Pulse 92    Temp 98.4 F (36.9 C) (Oral)    Resp (!) 21    SpO2 100%   Physical Exam Vitals signs and nursing note reviewed.  Constitutional:      General: He is in acute distress.     Appearance: He is well-developed.     Comments: Patient is somnolent and unresponsive at the  time of my initial evaluation however he awoke without intervention and became acutely agitated and paranoid.  HENT:     Head: Normocephalic and atraumatic.     Nose: Nose normal.  Eyes:     Conjunctiva/sclera: Conjunctivae normal.  Neck:     Musculoskeletal: Neck supple. No neck rigidity.  Cardiovascular:     Rate and Rhythm: Regular rhythm. Tachycardia present.     Pulses: Normal pulses.     Heart sounds: No murmur.  Pulmonary:     Effort: Pulmonary effort is normal. No respiratory distress.     Breath sounds: Normal breath sounds.  Abdominal:     Palpations: Abdomen is soft.     Tenderness: There is no abdominal tenderness. There is no right CVA tenderness or left CVA tenderness.  Skin:    General: Skin is warm and dry.  Neurological:     General: No focal deficit present.     Mental Status: He is alert.  Psychiatric:     Comments: Paranoid thought content and behavior. Visual hallucinations noted. States he sees his wife outside the door (his wife is in [place])      ED Treatments / Results  Labs (all labs ordered are listed, but only abnormal results are displayed) Labs Reviewed  COMPREHENSIVE METABOLIC PANEL - Abnormal; Notable for the following components:      Result Value   Glucose, Bld 133 (*)    All other components within normal limits  URINALYSIS, ROUTINE W REFLEX MICROSCOPIC - Abnormal; Notable for the following components:   APPearance CLOUDY (*)    Specific Gravity, Urine 1.031 (*)    Ketones, ur 20 (*)    Protein, ur 100 (*)    Bacteria, UA RARE (*)    All other components within normal limits  RAPID URINE DRUG SCREEN, HOSP PERFORMED - Abnormal; Notable for the following components:   Benzodiazepines POSITIVE (*)    Tetrahydrocannabinol POSITIVE (*)    All other components within normal limits  CBG MONITORING, ED - Abnormal; Notable for the following components:   Glucose-Capillary 141 (*)    All other components within normal limits  POCT I-STAT  EG7 - Abnormal; Notable for the following components:   pH, Ven 7.461 (*)    pCO2, Ven 37.1 (*)    pO2, Ven 131.0 (*)    Acid-Base Excess 3.0 (*)    Calcium, Ion 1.09 (*)    All other components within normal limits  NOVEL CORONAVIRUS, NAA (HOSP ORDER, SEND-OUT TO REF LAB; TAT 18-24 HRS)  LACTIC ACID, PLASMA  ETHANOL  CBC WITH DIFFERENTIAL/PLATELET  LACTIC ACID, PLASMA  CBC WITH DIFFERENTIAL/PLATELET    EKG EKG Interpretation  Date/Time:  Saturday June 22 2019 14:37:14 EDT Ventricular Rate:  128 PR Interval:    QRS Duration: 97 QT Interval:  315 QTC Calculation: 460 R Axis:   55 Text Interpretation:  Sinus tachycardia Confirmed by Blane Ohara [phone]) on 06/22/2019 3:59:56 PM   Radiology Ct Head Wo Contrast  Result Date: 06/22/2019 CLINICAL DATA:  33 year old male  with altered mental status. EXAM: CT HEAD WITHOUT CONTRAST TECHNIQUE: Contiguous axial images were obtained from the base of the skull through the vertex without intravenous contrast. COMPARISON:  Head CT dated 12/14/2007 FINDINGS: Brain: No evidence of acute infarction, hemorrhage, hydrocephalus, extra-axial collection or mass lesion/mass effect. Vascular: No hyperdense vessel or unexpected calcification. Skull: Normal. Negative for fracture or focal lesion. Sinuses/Orbits: No acute finding. Other: None IMPRESSION: No acute intracranial pathology. Electronically Signed   By: Anner Crete M.D.   On: 06/22/2019 23:19   Dg Chest Portable 1 View  Result Date: 06/22/2019 CLINICAL DATA:  Overdose EXAM: PORTABLE CHEST 1 VIEW COMPARISON:  01/09/2008 FINDINGS: The heart size and mediastinal contours are within normal limits. Both lungs are clear. The visualized skeletal structures are unremarkable. IMPRESSION: No acute abnormality of the lungs in AP portable projection. Electronically Signed   By: Eddie Candle M.D.   On: 06/22/2019 20:59    Procedures Procedures (including critical care time)  Medications Ordered  in ED Medications  LORazepam (ATIVAN) 2 MG/ML injection (2 mg  Given 06/22/19 1504)  haloperidol lactate (HALDOL) injection 5 mg (5 mg Intravenous Given 06/22/19 1623)     Initial Impression / Assessment and Plan / ED Course  I have reviewed the triage vital signs and the nursing notes.  Pertinent labs & imaging results that were available during my care of the patient were reviewed by me and considered in my medical decision making (see chart for details).        Patient is a 33 year old male with history of physical exam as above presents emergency department for evaluation of paranoid behavior, unresponsiveness, hyperreligiosity, and altered mental status.  At the time of his initial presentation patient was unconscious with a GCS of 4 (1,1,2).  IV access was immediately established and blood gas was obtained for altered mental status.  During initial assessment and consideration of possible intubation for low GCS woke up and became agitated.  He had received 2 mg of naloxone prior to arrival in the emergency department with PD however this was about 20 minutes or more prior to increase in responsiveness and unlikely to be the cause of his improvement.  He woke up and began asking for his wife saying that he sees her in the hallway and that we are keeping him from seeing her.  His brother is at the bedside and states that his wife is in [place].  Reportedly he has had these paranoid behaviors and inappropriate hypersexual conversations with family members for several months recently and has no previous history of same.  Due to initial presentation concerning for altered mental status of possible new psychiatric break versus toxicologic cause patient was given Ativan and Haldol for his agitation.  He was tachycardic to 130 bpm at the time of arrival and was given an IV fluid bolus.  CT head was also obtained as well as labs including CBC, metabolic panel, urine drug screen, and the previously  mentioned blood gas.  Labs demonstrated pH 7.461 with pCO2 of 37.1 (likely related to tachypnea).  EKG demonstrated no emergent findings.  CT head demonstrated NAIA.   Patient was reevaluated numerous times during the course of his time in the emergency department.  On reevaluation patient is more somnolent and somewhat more cooperative with his siblings present.  Patient required IVC for altered mental status with concern for possible psychiatric etiology and due to concern for harm to himself and others.  Patient admitted to medicine service for medical  clearance and likely psychiatric evaluation.  Final Clinical Impressions(s) / ED Diagnoses   Final diagnoses:  Disorientation  Paranoia (psychosis) [facility])    ED Discharge Orders    None       Jonna Clark, MD 06/23/19 Nydia Bouton    Blane Ohara, MD 06/24/19 [phone]

## 2019-06-22 NOTE — ED Triage Notes (Signed)
Per [facility], pt's friends received a call from him stating that he was having vague suicidal ideation. They went to check on him and he was unresponsive in his car at his father's apartment complex. PD initially on scene gave narcan and they did not feel a radial pulse and they did 30 compressions and then pt had pulse. Unknown if pt really lost pulses. Pt has had bounding radial in route here. Noted to be tachy around 130, BP 166/100, RR 18, CBG 155, 99% on RA. Unknown what pt took

## 2019-06-22 NOTE — ED Notes (Signed)
Pt being ivc'd due to refusing ct and x-ray. Pt calm and alert at this time.

## 2019-06-22 NOTE — ED Notes (Signed)
Pt now talking, stating "where is my wife, she was with me" pt informed that wife is not here. Pt's brother and sister are here and one of them is coming back to the room.

## 2019-06-22 NOTE — ED Notes (Signed)
Pt now has eyes open, still not following commands.

## 2019-06-22 NOTE — ED Notes (Addendum)
Pt refused chest xray and CT head, Provider aware.

## 2019-06-22 NOTE — ED Notes (Addendum)
Steve Fuller, sister, would like a phone call with updates. Phone number is [phone] Steve Fuller [phone] will be the visitor when able. He would like updates

## 2019-06-23 DIAGNOSIS — R410 Disorientation, unspecified: Secondary | ICD-10-CM | POA: Insufficient documentation

## 2019-06-23 DIAGNOSIS — G43709 Chronic migraine without aura, not intractable, without status migrainosus: Secondary | ICD-10-CM

## 2019-06-23 DIAGNOSIS — F22 Delusional disorders: Secondary | ICD-10-CM

## 2019-06-23 DIAGNOSIS — F3113 Bipolar disorder, current episode manic without psychotic features, severe: Secondary | ICD-10-CM

## 2019-06-23 LAB — ACETAMINOPHEN LEVEL: Acetaminophen (Tylenol), Serum: <10 ug/mL — ABNORMAL LOW (ref 10–30)

## 2019-06-23 LAB — HEMOGLOBIN A1C
Hgb A1c MFr Bld: 5.7 % — ABNORMAL HIGH (ref 4.8–5.6)
Mean Plasma Glucose: 116.89 mg/dL

## 2019-06-23 LAB — LACTIC ACID, PLASMA: Lactic Acid, Venous: 1 mmol/L (ref 0.5–1.9)

## 2019-06-23 LAB — MAGNESIUM: Magnesium: 2.1 mg/dL (ref 1.7–2.4)

## 2019-06-23 LAB — HIV ANTIBODY (ROUTINE TESTING W REFLEX): HIV Screen 4th Generation wRfx: NONREACTIVE

## 2019-06-23 LAB — TSH: TSH: 0.447 u[IU]/mL (ref 0.350–4.500)

## 2019-06-23 MED ORDER — ENOXAPARIN SODIUM 40 MG/0.4ML ~~LOC~~ SOLN
40.0000 mg | Freq: Every day | SUBCUTANEOUS | Status: DC
  Filled 2019-06-23: qty 0.4

## 2019-06-23 MED ORDER — SODIUM CHLORIDE 0.9 % IV SOLN: 250.0000 mL | INTRAVENOUS | Status: DC | PRN

## 2019-06-23 MED ORDER — SODIUM CHLORIDE 0.9% FLUSH: 3.0000 mL | INTRAVENOUS | Status: DC | PRN

## 2019-06-23 MED ORDER — ARIPIPRAZOLE 5 MG PO TABS
10.0000 mg | ORAL_TABLET | Freq: Every day | ORAL | Status: DC
  Administered 2019-06-23 – 2019-06-24 (×2): 10 mg via ORAL
  Filled 2019-06-23 (×2): qty 2

## 2019-06-23 MED ORDER — ACETAMINOPHEN 650 MG RE SUPP: 650.0000 mg | Freq: Four times a day (QID) | RECTAL | Status: DC | PRN

## 2019-06-23 MED ORDER — SODIUM CHLORIDE 0.9% FLUSH
3.0000 mL | Freq: Two times a day (BID) | INTRAVENOUS | Status: DC
  Administered 2019-06-23 – 2019-06-24 (×2): 3 mL via INTRAVENOUS

## 2019-06-23 MED ORDER — ENOXAPARIN SODIUM 40 MG/0.4ML ~~LOC~~ SOLN: 40.0000 mg | SUBCUTANEOUS | Status: DC

## 2019-06-23 MED ORDER — POLYETHYLENE GLYCOL 3350 17 G PO PACK: 17.0000 g | PACK | Freq: Every day | ORAL | Status: DC | PRN

## 2019-06-23 MED ORDER — ACETAMINOPHEN 325 MG PO TABS: 650.0000 mg | ORAL_TABLET | Freq: Four times a day (QID) | ORAL | Status: DC | PRN

## 2019-06-23 NOTE — ED Notes (Signed)
Pt given food and beverage. Sitter and family at bedside.

## 2019-06-23 NOTE — ED Notes (Signed)
Ordered breakfast-- Grosser 

## 2019-06-23 NOTE — Progress Notes (Signed)
Family Medicine Teaching Service Daily Progress Note Intern Pager: [phone]  Patient name: Steve Fuller Medical record number: [id number] Date of birth: 19-Apr-1986 Age: 33 y.o. Gender: male  Primary Care Provider: Patient, No Pcp Per Consultants: psych Code Status: full  Pt Overview and Major Events to Date:  10/24 - admitted for acute mania.   Assessment and Plan: Steve Fuller is a 33 y.o. male presenting with new onset episode of acute mania in previously undiagnosed patient with bipolar disorder.Marland Kitchen PMH is significant for migraines  Acute manic episode- exhibiting classic signs of mania for approximately 1.5-2 weeks: delusions of grandeur, decreased sleep, agitation, pressured speech, tangentiality. UDS shows benzos and THC, which was expected. TSH is normal. Labs otherwise normal. Unknown if patient ingested anything prior to admission, but he is currently stable.  Patient was given haldol and ativan in the ED. Awaiting psych to evaluate patient.  Pt currently sleeping in his room with wife in the ED.  -Consult to psychiatry for evaluation and placement. Patient's family would like him to go to the hospital in [place] if possible.  - f/u tylenol level, low suspicion for toxic ingestion -Safety sitter ordered -If patient becomes agitated, can give Haldol as needed.  No order placed currently. -Vitals per routine -In and out per shift -Up with assistance -Regular diet  Migraines-unable to get thorough history from patient due to his mania, but wife states he gets medical marijuana for this as well as chronic shoulder pain. -Tylenol as needed  S/p root canal-why states he had root canal performed earlier this month and is currently taking antibiotic, but she does not know the dose, duration, or name of the antibiotic.  Patient allergic to amoxicillin.  Asked wife to try to find out this medication. -Restart medication if wife is able to figure out which medication it  is.  FEN/GI: Regular diet Prophylaxis: Lovenox  Disposition: inpatient psych  Subjective:  Patient denies HA, chest pain, abd pain.  Denies suicidal thoughts. Has No questions or concerns.  Does not remember what happened prior to passing out in his car. Denies drug ingestion.   Per the patient's sister he was hyperreligious and saying hypersexual (talking about his body in front of his mother) yesterday.  When she saw him he was in his car and was unresponsive.  She states the emt gave him narcan which did not help.  She states she doubts he intentionally overdosed on any medications.  Mother and father went to magistrate to have him psychiatrically committed because he was scaring his oldest son with some of the things he was talking about, including death. They later found him in the car unresponsive and called ems. He does not think the patient ingested anything.   Objective: Temp:  [98 F (36.7 C)-98.4 F (36.9 C)] 98 F (36.7 C) (10/25 0512) Pulse Rate:  [70-128] 81 (10/25 0515) Resp:  [0-29] 21 (10/25 0515) BP: (130-194)/(73-122) 130/73 (10/25 0400) SpO2:  [96 %-100 %] 98 % (10/25 0515) Physical Exam: General: sitting up in bed, speaking with his wife when I encountered him. Alert, no acute distress.  Cardiovascular: RRR. No murmurs. 2+ radial pulse.  Respiratory: LCTAB. No wheeze.  Psych: appears mildly agitated when asked about suicidal thoughts. Makes eye contact.  Speaks at normal rate, slightly increased volume.   Laboratory: Recent Labs  Lab 06/22/19 1510 06/22/19 1519  WBC 9.4  --   HGB 13.9 15.3  HCT 43.7 45.0  PLT 223  --  Recent Labs  Lab 06/22/19 1508 06/22/19 1519  NA 140 140  K 4.2 4.1  CL 105  --   CO2 22  --   BUN 8  --   CREATININE 1.17  --   CALCIUM 9.3  --   PROT 7.4  --   BILITOT 1.2  --   ALKPHOS 60  --   ALT 26  --   AST 27  --   GLUCOSE 133*  --      Benay Pike, MD 06/23/2019, 6:33 AM PGY-2, [place] Intern pager: [phone], text pages welcome

## 2019-06-23 NOTE — Consult Note (Signed)
[facility] Consult   Reason for Consult: Manic episode Referring Physician: Hospitalist Patient Identification: Steve Fuller MRN:  [id number] Principal Diagnosis: Bipolar I disorder, current or most recent episode manic, severe ([place]) Diagnosis:  Principal Problem:   Bipolar I disorder, current or most recent episode manic, severe ([place]) Active Problems:   Manic episode ([place])   Total Time spent with patient: 1 hour  Subjective:   Steve Fuller is a 33 y.o. male patient reports today that he is feeling better.  He states that he slept good last night because they gave him some medication.  Patient reports that he has been trying to do multiple things recently such as started new job, his music, moving from [place], and trying to buy excessive amounts of things that are not absolutely needed at this time.  Patient does report little to no sleep for several days and this is confirmed by his wife who is in the room with him.  She states that he has had extremely pressured speech and a lot of times his speech is not making any sense.  He states in agreement with her from what he can remember.  He states that at the time what he was saying and seemed to make sense but now understands that things were getting out of control.  Patient does report of taking the kids and driving to [place] and then will drive him back to [place] and becoming agitated and upset very quickly.  He states that this has been going on for approximately the last 5 years, however his wife is sitting behind him shaking her head no that it has not.  She does confirm that he has had some minor episodes but nothing like this in the past.  She states that the night that the police were called because of him being unresponsive but neither 1 them are exactly sure what happened.  He states that he has never attempted suicide and has never thought about killing himself because he loves his kids and his wife too much.   Patient's wife agrees that he has never made any comments about wanting to harm himself or to harm anyone else and she does not feel that he is a threat to himself.  She does report that he was having some delusional thoughts about 12 different people calling his phone telling him that he should not take his children and that people were trying to come after him and that they have called the police on him.  He states that he went by the police department when all this happened on Thursday and showed the police that his kids were fine and that he wanted them to do a safety check on them and the police agreed with him that his kids were fine and that he can go.  Patient states that he did not attempt to overdose on anything but he does know that he became unresponsive because that is what is been told and he reports that his chest is severely sore because he was told to do chest compressions on him.  Per the report of the patient this is incident happened in front of his children and he is concerned about how they are thinking about their father now and wants to see them.  Patient is stating understanding of a manic episode and being diagnosed with bipolar 1 disorder and is in agreement to taking medications.  HPI:  33 y.o. male presenting with approximately 1.5 to 2 weeks of  increasing agitation and other manic symptoms.  Patient was living with his wife in [place], when she noticed that he was becoming more agitated, talking more frequently and for longer periods of time at a faster rate of speech.  She  stated he was becoming more tangential and was having delusions such as talking to God.  Also requiring less sleep, going to bed at 5: to 6 in the morning and then waking up a few hours later.  She states he took the children, ages 19 and 75, a few days ago down to [place] without her to stay with his parents.  According to EMS and the emergency provider, patient had an unresponsive episode for which they did  30 chest compressions, but appears patient never lost his pulse.  Patient was given naloxone in the ED which did not have any effect.  He did become more responsive in the ED after getting Ativan and Haldol.  When I spoke with the patient he was able to answer where he was, but refused to answer the year or month and then refused to answer any more for questions.  Patient would repeatedly say that we do not believe him.  Assessment: Patient presents in the bed talking to his wife.  Patient gets up and sits on the side of the bed and has walked around the end of the bed and is showing signs of some minor hypomanic symptoms.  Patient still continues to have pressured speech but states that he slept last night and that is improved his demeanor.  Patient is still labile with just the thought of his grandmother passed away he went from laughing and joking with me to crying about his grandmother and then back to laughing and joking with me and talking about the events that happened.  He is redirectable but does take some time with discussing things his wife about plans for the future as far as how things need to progress with him still continuing to try to move from [place].  At this time the patient does not meet inpatient criteria and is psychiatric cleared.  Past Psychiatric History: No suicide attempts, no hospitalizations, no medications  Risk to Self:   Risk to Others:   Prior Inpatient Therapy:   Prior Outpatient Therapy:    Past Medical History:  Past Medical History:  Diagnosis Date  . Sarcoidosis    No past surgical history on file. Family History: No family history on file. Family Psychiatric  History: None reported Social History:  Social History   Substance and Sexual Activity  Alcohol Use No     Social History   Substance and Sexual Activity  Drug Use No    Social History   Socioeconomic History  . Marital status: Single    Spouse name: Not on file  . Number of children:  Not on file  . Years of education: Not on file  . Highest education level: Not on file  Occupational History  . Not on file  Social Needs  . Financial resource strain: Not on file  . Food insecurity    Worry: Not on file    Inability: Not on file  . Transportation needs    Medical: Not on file    Non-medical: Not on file  Tobacco Use  . Smoking status: Light Tobacco Smoker    Packs/day: 0.50  Substance and Sexual Activity  . Alcohol use: No  . Drug use: No  . Sexual activity: Yes  Lifestyle  .  Physical activity    Days per week: Not on file    Minutes per session: Not on file  . Stress: Not on file  Relationships  . Social Musician on phone: Not on file    Gets together: Not on file    Attends religious service: Not on file    Active member of club or organization: Not on file    Attends meetings of clubs or organizations: Not on file    Relationship status: Not on file  Other Topics Concern  . Not on file  Social History Narrative  . Not on file   Additional Social History:    Allergies:   Allergies  Allergen Reactions  . Penicillins Rash  . Amoxicillin Swelling  . Food Hives, Itching and Rash    Mango      Labs:  Results for orders placed or performed during the hospital encounter of 06/22/19 (from the past 48 hour(s))  CBG monitoring, ED     Status: Abnormal   Collection Time: 06/22/19  2:42 PM  Result Value Ref Range   Glucose-Capillary 141 (H) 70 - 99 mg/dL  Lactic acid, plasma     Status: None   Collection Time: 06/22/19  3:08 PM  Result Value Ref Range   Lactic Acid, Venous 1.2 0.5 - 1.9 mmol/L    Comment: Performed at [facility], [address] [place], [place] [address]  Comprehensive metabolic panel     Status: Abnormal   Collection Time: 06/22/19  3:08 PM  Result Value Ref Range   Sodium 140 135 - 145 mmol/L   Potassium 4.2 3.5 - 5.1 mmol/L    Comment: SLIGHT HEMOLYSIS   Chloride 105 98 - 111 mmol/L   CO2 22 22 - 32  mmol/L   Glucose, Bld 133 (H) 70 - 99 mg/dL   BUN 8 6 - 20 mg/dL   Creatinine, Ser 9.52 0.61 - 1.24 mg/dL   Calcium 9.3 8.9 - 84.1 mg/dL   Total Protein 7.4 6.5 - 8.1 g/dL   Albumin 4.3 3.5 - 5.0 g/dL   AST 27 15 - 41 U/L   ALT 26 0 - 44 U/L   Alkaline Phosphatase 60 38 - 126 U/L   Total Bilirubin 1.2 0.3 - 1.2 mg/dL   GFR calc non Af Amer >60 >60 mL/min   GFR calc Af Amer >60 >60 mL/min   Anion gap 13 5 - 15    Comment: Performed at [facility], [address], [place], [place] [address]  Ethanol     Status: None   Collection Time: 06/22/19  3:08 PM  Result Value Ref Range   Alcohol, Ethyl (B) <10 <10 mg/dL    Comment: (NOTE) Lowest detectable limit for serum alcohol is 10 mg/dL. For medical purposes only. Performed at [facility], [address], [place], [place] [address]   CBC with Differential/Platelet     Status: None   Collection Time: 06/22/19  3:10 PM  Result Value Ref Range   WBC 9.4 4.0 - 10.5 K/uL   RBC 4.97 4.22 - 5.81 MIL/uL   Hemoglobin 13.9 13.0 - 17.0 g/dL   HCT 53.6 64.4 - 03.4 %   MCV 87.9 80.0 - 100.0 fL   MCH 28.0 26.0 - 34.0 pg   MCHC 31.8 30.0 - 36.0 g/dL   RDW 74.2 59.5 - 63.8 %   Platelets 223 150 - 400 K/uL   nRBC 0.0  0.0 - 0.2 %   Neutrophils Relative % 75 %   Neutro Abs 7.1 1.7 - 7.7 K/uL   Lymphocytes Relative 15 %   Lymphs Abs 1.4 0.7 - 4.0 K/uL   Monocytes Relative 9 %   Monocytes Absolute 0.9 0.1 - 1.0 K/uL   Eosinophils Relative 0 %   Eosinophils Absolute 0.0 0.0 - 0.5 K/uL   Basophils Relative 1 %   Basophils Absolute 0.1 0.0 - 0.1 K/uL   Immature Granulocytes 0 %   Abs Immature Granulocytes 0.03 0.00 - 0.07 K/uL    Comment: Performed at [facility], [address], [place], [place] [address]  POCT I-Stat EG7     Status: Abnormal   Collection Time: 06/22/19  3:19 PM  Result Value Ref Range   pH, Ven 7.461 (H) 7.250 - 7.430   pCO2, Ven 37.1 (L) 44.0 - 60.0 mmHg   pO2, Ven 131.0 (H) 32.0 - 45.0 mmHg    Bicarbonate 26.4 20.0 - 28.0 mmol/L   TCO2 28 22 - 32 mmol/L   O2 Saturation 99.0 %   Acid-Base Excess 3.0 (H) 0.0 - 2.0 mmol/L   Sodium 140 135 - 145 mmol/L   Potassium 4.1 3.5 - 5.1 mmol/L   Calcium, Ion 1.09 (L) 1.15 - 1.40 mmol/L   HCT 45.0 39.0 - 52.0 %   Hemoglobin 15.3 13.0 - 17.0 g/dL   Patient temperature HIDE    Sample type VENOUS   Urinalysis, Routine w reflex microscopic     Status: Abnormal   Collection Time: 06/22/19  8:40 PM  Result Value Ref Range   Color, Urine YELLOW YELLOW   APPearance CLOUDY (A) CLEAR   Specific Gravity, Urine 1.031 (H) 1.005 - 1.030   pH 5.0 5.0 - 8.0   Glucose, UA NEGATIVE NEGATIVE mg/dL   Hgb urine dipstick NEGATIVE NEGATIVE   Bilirubin Urine NEGATIVE NEGATIVE   Ketones, ur 20 (A) NEGATIVE mg/dL   Protein, ur 604 (A) NEGATIVE mg/dL   Nitrite NEGATIVE NEGATIVE   Leukocytes,Ua NEGATIVE NEGATIVE   RBC / HPF 0-5 0 - 5 RBC/hpf   WBC, UA 0-5 0 - 5 WBC/hpf   Bacteria, UA RARE (A) NONE SEEN   Mucus PRESENT    Ca Oxalate Crys, UA PRESENT     Comment: Performed at [facility], [address] [place], [place] [address]  Rapid urine drug screen (hospital performed)     Status: Abnormal   Collection Time: 06/22/19  8:40 PM  Result Value Ref Range   Opiates NONE DETECTED NONE DETECTED   Cocaine NONE DETECTED NONE DETECTED   Benzodiazepines POSITIVE (A) NONE DETECTED   Amphetamines NONE DETECTED NONE DETECTED   Tetrahydrocannabinol POSITIVE (A) NONE DETECTED   Barbiturates NONE DETECTED NONE DETECTED    Comment: (NOTE) DRUG SCREEN FOR MEDICAL PURPOSES ONLY.  IF CONFIRMATION IS NEEDED FOR ANY PURPOSE, NOTIFY LAB WITHIN 5 DAYS. LOWEST DETECTABLE LIMITS FOR URINE DRUG SCREEN Drug Class                     Cutoff (ng/mL) Amphetamine and metabolites    1000 Barbiturate and metabolites    200 Benzodiazepine                 200 Tricyclics and metabolites     300 Opiates and metabolites        300 Cocaine and metabolites        300 THC  50 Performed at [facility], [address] [place], [place] [address]   HIV Antibody (routine testing w rflx)     Status: None   Collection Time: 06/23/19  4:37 AM  Result Value Ref Range   HIV Screen 4th Generation wRfx NON REACTIVE NON REACTIVE    Comment: Performed at [facility], [address] [place], [place] [address]  Hemoglobin A1c     Status: Abnormal   Collection Time: 06/23/19  4:37 AM  Result Value Ref Range   Hgb A1c MFr Bld 5.7 (H) 4.8 - 5.6 %    Comment: (NOTE) Pre diabetes:          5.7%-6.4% Diabetes:              >6.4% Glycemic control for   <7.0% adults with diabetes    Mean Plasma Glucose 116.89 mg/dL    Comment: Performed at [facility], [address], [place], [place] [address]  TSH     Status: None   Collection Time: 06/23/19  4:37 AM  Result Value Ref Range   TSH 0.447 0.350 - 4.500 uIU/mL    Comment: Performed by a 3rd Generation assay with a functional sensitivity of <=0.01 uIU/mL. Performed at [facility], [address] [place], [place] [address]   Magnesium     Status: None   Collection Time: 06/23/19  8:28 AM  Result Value Ref Range   Magnesium 2.1 1.7 - 2.4 mg/dL    Comment: Performed at [facility], [address], [place], [place] [address]  Acetaminophen level     Status: Abnormal   Collection Time: 06/23/19  8:28 AM  Result Value Ref Range   Acetaminophen (Tylenol), Serum <10 (L) 10 - 30 ug/mL    Comment: (NOTE) Therapeutic concentrations vary significantly. A range of 10-30 ug/mL  may be an effective concentration for many patients. However, some  are best treated at concentrations outside of this range. Acetaminophen concentrations >150 ug/mL at 4 hours after ingestion  and >50 ug/mL at 12 hours after ingestion are often associated with  toxic reactions. Performed at [facility], [address] [place], [place] [address]   Lactic acid, plasma     Status: None    Collection Time: 06/23/19  2:26 PM  Result Value Ref Range   Lactic Acid, Venous 1.0 0.5 - 1.9 mmol/L    Comment: Performed at [facility], [address], [place], [place] [address]    Current Facility-Administered Medications  Medication Dose Route Frequency Provider Last Rate Last Dose  . 0.9 %  sodium chloride infusion  250 mL Intravenous PRN Sandre Kitty, MD      . acetaminophen (TYLENOL) tablet 650 mg  650 mg Oral Q6H PRN Sandre Kitty, MD       Or  . acetaminophen (TYLENOL) suppository 650 mg  650 mg Rectal Q6H PRN Sandre Kitty, MD      . ARIPiprazole (ABILIFY) tablet 10 mg  10 mg Oral Daily Mirian Mo, MD   10 mg at 06/23/19 1605  . enoxaparin (LOVENOX) injection 40 mg  40 mg Subcutaneous Daily Hensel, Santiago Bumpers, MD      . polyethylene glycol (MIRALAX / GLYCOLAX) packet 17 g  17 g Oral Daily PRN Sandre Kitty, MD      . sodium chloride flush (NS) 0.9 % injection 3 mL  3 mL Intravenous Q12H Sandre Kitty,  MD   3 mL at 06/23/19 1031  . sodium chloride flush (NS) 0.9 % injection 3 mL  3 mL Intravenous PRN Sandre Kittylson, Daniel K, MD        Musculoskeletal: Strength & Muscle Tone: within normal limits Gait & Station: normal Patient leans: N/A  Psychiatric Specialty Exam: Physical Exam  Nursing note and vitals reviewed. Constitutional: He is oriented to person, place, and time. He appears well-developed and well-nourished.  Cardiovascular: Normal rate.  Respiratory: Effort normal.  Musculoskeletal: Normal range of motion.  Neurological: He is alert and oriented to person, place, and time.  Skin: Skin is warm.    Review of Systems  Constitutional: Negative.   HENT: Negative.   Eyes: Negative.   Respiratory: Negative.   Cardiovascular: Negative.   Gastrointestinal: Negative.   Genitourinary: Negative.   Musculoskeletal: Negative.   Skin: Negative.   Neurological: Negative.   Endo/Heme/Allergies: Negative.   Psychiatric/Behavioral:       Manic symptoms of  little to no sleep, pressured speech, and flight of ideas    Blood pressure (!) 160/99, pulse 87, temperature 98.7 F (37.1 C), temperature source Oral, resp. rate 18, SpO2 100 %.There is no height or weight on file to calculate BMI.  General Appearance: Casual  Eye Contact:  Good  Speech:  Pressured  Volume:  Increased  Mood:  Anxious and Euphoric  Affect:  Labile  Thought Process:  Goal Directed and Descriptions of Associations: Intact  Orientation:  Full (Time, Place, and Person)  Thought Content:  WDL  Suicidal Thoughts:  No  Homicidal Thoughts:  No  Memory:  Immediate;   Good Recent;   Good Remote;   Good  Judgement:  Fair  Insight:  Fair  Psychomotor Activity:  Normal  Concentration:  Concentration: Good  Recall:  Good  Fund of Knowledge:  Good  Language:  Good  Akathisia:  No  Handed:  Right  AIMS (if indicated):     Assets:  Desire for Improvement Financial Resources/Insurance Housing Intimacy Physical Health Social Support Transportation  ADL's:  Intact  Cognition:  WNL  Sleep:        Treatment Plan Summary: Medication management  Start Abilify 10 mg p.o. daily for bipolar 1 disorder Social work consult to assist patient with follow-up psychiatric treatment Continued education on bipolar 1 disorder to include symptoms and treatment  Disposition: No evidence of imminent risk to self or others at present.   Patient does not meet criteria for psychiatric inpatient admission. Supportive therapy provided about ongoing stressors. Discussed crisis plan, support from social network, calling 911, coming to the Emergency Department, and calling Suicide Hotline.  Gerlene Burdockravis B Karon Heckendorn, FNP 06/23/2019 4:09 PM

## 2019-06-23 NOTE — ED Notes (Signed)
Report attempted 

## 2019-06-24 LAB — NOVEL CORONAVIRUS, NAA (HOSP ORDER, SEND-OUT TO REF LAB; TAT 18-24 HRS): SARS-CoV-2, NAA: NOT DETECTED

## 2019-06-24 MED ORDER — ARIPIPRAZOLE 10 MG PO TABS: 10.0000 mg | ORAL_TABLET | Freq: Every day | ORAL | 0 refills | Status: AC

## 2019-06-24 MED FILL — ARIPIPRAZOLE 10 MG TABS: 10 | 30 days supply | Qty: 30 | Fill #0

## 2019-06-24 NOTE — Progress Notes (Signed)
DISCHARGE NOTE  Zamire Whitehurst to be discharged Home per MD order. Patient verbalized understanding.  Skin clean, dry and intact without evidence of skin break down, no evidence of skin tears noted. IV catheter discontinued intact. Site without signs and symptoms of complications. Dressing and pressure applied. Pt denies pain at the site currently. No complaints noted.  Patient free of lines, drains, and wounds.   Discharge packet assembled. An After Visit Summary (AVS) was printed and given to the patient at bedside. Patient escorted via wheelchair and discharged to home via private transportation. All questions and concerns addressed.   Dolores Hoose, RN

## 2019-06-24 NOTE — Progress Notes (Addendum)
Family Medicine Teaching Service Daily Progress Note Intern Pager: [phone]  Patient name: Steve Fuller Medical record number: [id number] Date of birth: 07-Jan-1986 Age: 33 y.o. Gender: male  Primary Care Provider: Patient, No Pcp Per Consultants: Psychiatry Code Status: Full  Pt Overview and Major Events to Date:  10/24-admitted for acute mania  Assessment and Plan: Steve Bellamyis a 33 y.o.malepresenting with new onset episode of acute mania in previously undiagnosed patient with bipolar disorder.Marland Kitchen PMH is significant formigraines  Acute manic episode No acute events overnight.  No as needed meds given.  Vital signs stable.  BP 145/95.  Per psychiatry no evidence of imminent risk to self or others at present.  Does not meet criteria for psychiatric inpatient admission.  Acetaminophen level negative.  On exam alert and cooperative.  No suicidal ideation or homicidal ideation.  Reports having slept well overnight. -Psychiatry following -Continue Abilify 10 mg daily -Social work consult for assistance with follow-up psychiatric treatment -d/c Safety sitter -d/c IVC  Migraines Asymptomatic.  Chronic medical THC user -Tylenol as needed  S/p root canal On exam patient patient reports no pain. -Follow-up with wife regarding medication for root canal -Tylenol as needed  FEN/GI: -Regular diet Prophylaxis: -Lovenox 40 mg daily  Disposition: Home when cleared by psych.  Subjective:  No acute events overnight.  Patient states he slept well last night.  Denies any headaches, suicidal or homicidal ideation.  Denies any hallucinations, auditory or visual.  Patient reports tolerating new medication.  States that helped him relax.  Objective: Temp:  [98.2 F (36.8 C)-99.1 F (37.3 C)] 99.1 F (37.3 C) (10/25 1942) Pulse Rate:  [61-89] 82 (10/25 1942) Resp:  [16-27] 16 (10/25 1942) BP: (145-160)/(95-99) 145/95 (10/25 1942) SpO2:  [99 %-100 %] 99 % (10/25  1942) Physical Exam: General: Pleasant 33 year old male, in no acute distress Cardiovascular: Regular rate and rhythm, no murmurs appreciated Respiratory: Clear to auscultation bilaterally, no crackles, no rhonchi, no increased work of breathing Abdomen: Soft, nondistended, nontender, bowel sounds present Extremities: Moving all extremities, no lower extremity edema Neuro: A&Ox4 Psych: No suicidal ideation, no homicidal ideation.  Pleasant and cooperative, answers questions appropriately  Laboratory: Recent Labs  Lab 06/22/19 1510 06/22/19 1519  WBC 9.4  --   HGB 13.9 15.3  HCT 43.7 45.0  PLT 223  --    Recent Labs  Lab 06/22/19 1508 06/22/19 1519  NA 140 140  K 4.2 4.1  CL 105  --   CO2 22  --   BUN 8  --   CREATININE 1.17  --   CALCIUM 9.3  --   PROT 7.4  --   BILITOT 1.2  --   ALKPHOS 60  --   ALT 26  --   AST 27  --   GLUCOSE 133*  --      Imaging/Diagnostic Tests:  Carollee Leitz, MD 06/24/2019, 5:37 AM PGY-1, [facility] Intern pager: [phone], text pages welcome

## 2019-06-24 NOTE — Plan of Care (Signed)
  Problem: Education: Goal: Knowledge of General Education information will improve Description: Including pain rating scale, medication(s)/side effects and non-pharmacologic comfort measures 06/24/2019 1355 by Dolores Hoose, RN Outcome: Adequate for Discharge 06/24/2019 2952 by Dolores Hoose, RN Outcome: Progressing   Problem: Health Behavior/Discharge Planning: Goal: Ability to manage health-related needs will improve Outcome: Adequate for Discharge   Problem: Clinical Measurements: Goal: Ability to maintain clinical measurements within normal limits will improve Outcome: Adequate for Discharge   Problem: Clinical Measurements: Goal: Ability to maintain clinical measurements within normal limits will improve Outcome: Adequate for Discharge Goal: Will remain free from infection Outcome: Adequate for Discharge Goal: Diagnostic test results will improve Outcome: Adequate for Discharge Goal: Respiratory complications will improve Outcome: Adequate for Discharge Goal: Cardiovascular complication will be avoided Outcome: Adequate for Discharge

## 2019-06-24 NOTE — Discharge Summary (Addendum)
[facility] Discharge Summary  Patient name: Steve Fuller Medical record number: [id number] Date of birth: 24-Jun-1986 Age: 33 y.o. Gender: male Date of Admission: 06/22/2019  Date of Discharge: 06/24/2019 Admitting Physician: Zenia Resides, MD  Primary Care Provider: Patient, No Pcp Per Consultants: Psychiatry   Indication for Hospitalization: mania   Discharge Diagnoses/Problem List:  Principal Problem:   Bipolar I disorder, current or most recent episode manic, severe ([place]) Active Problems:   Manic episode ([place])  Disposition: Home  Discharge Condition: Stable  Discharge Exam:  General: Pleasant 33 year old male, in no acute distress Cardiovascular: Regular rate and rhythm, no murmurs appreciated Respiratory: Clear to auscultation bilaterally, no crackles, no rhonchi, no increased work of breathing Abdomen: Soft, nondistended, nontender, bowel sounds present Extremities: Moving all extremities, no lower extremity edema Neuro: A&Ox4 Psych: No suicidal ideation, no homicidal ideation.  Pleasant and cooperative, answers questions appropriately  Brief Hospital Course:  Steve Fuller is a 33 y.o. male who presented with maniac symptoms. PMH is significant for migraines, bipolar disorder.   Bipolar I Disorder, ?new onset Mr. Steve Fuller is originally from [place] and presented to the ED with ~2 weeks of increasing signs of mania including decreased sleep, delusions of grandeur, paranoid delusions, pressured/tangential speech. Patient's wife states that he has never been diagnosed with psychiatric illness prior to this episode and has medical marijuana card for history of migraines. Patient was initially uncooperative with questions and so history was provided by the family. Patient was treated with 5mg  IV Haldol and 2 mg IV Ativan for agitation. Patient then became less agitated and was admitted for psychiatric evaluation and observation. Psychiatry was  consulted and later evaluated the patient with recommendations that the patient did not meet inpatient psychiatric criteria, started on 10 mg of Abilify for the diagnosis of Bipolar 1 disorder and was psychiatrically cleared with outpatient follow up.  Migraines  Patient prescribed medical mariajuana for this. Unable to assess if patient was experiencing symptoms of migraine upon admission.Tylenol was ordered as needed.   S/p Root Canal Patient reports having a root canal earlier this month and scheduled to have finished antibiotic therapy (clindamycin, azithromycin).  Issues for Follow Up:  1. Medication Changes: 1. Started on abilify 10mg . 2. Ensure patient has adequate follow-up with psychiatry, ambulatory referral sent on discharge.  Significant Procedures: none  Significant Labs and Imaging:  Recent Labs  Lab 06/22/19 1510 06/22/19 1519  WBC 9.4  --   HGB 13.9 15.3  HCT 43.7 45.0  PLT 223  --    Recent Labs  Lab 06/22/19 1508 06/22/19 1519 06/23/19 0828  NA 140 140  --   K 4.2 4.1  --   CL 105  --   --   CO2 22  --   --   GLUCOSE 133*  --   --   BUN 8  --   --   CREATININE 1.17  --   --   CALCIUM 9.3  --   --   MG  --   --  2.1  ALKPHOS 60  --   --   AST 27  --   --   ALT 26  --   --   ALBUMIN 4.3  --   --     Ct Head Wo Contrast  Result Date: 06/22/2019 CLINICAL DATA:  33 year old male with altered mental status. EXAM: CT HEAD WITHOUT CONTRAST TECHNIQUE: Contiguous axial images were obtained from the base of the skull through the  vertex without intravenous contrast. COMPARISON:  Head CT dated 12/14/2007 FINDINGS: Brain: No evidence of acute infarction, hemorrhage, hydrocephalus, extra-axial collection or mass lesion/mass effect. Vascular: No hyperdense vessel or unexpected calcification. Skull: Normal. Negative for fracture or focal lesion. Sinuses/Orbits: No acute finding. Other: None IMPRESSION: No acute intracranial pathology. Electronically Signed   By: Elgie Collard M.D.   On: 06/22/2019 23:19   Dg Chest Portable 1 View  Result Date: 06/22/2019 CLINICAL DATA:  Overdose EXAM: PORTABLE CHEST 1 VIEW COMPARISON:  01/09/2008 FINDINGS: The heart size and mediastinal contours are within normal limits. Both lungs are clear. The visualized skeletal structures are unremarkable. IMPRESSION: No acute abnormality of the lungs in AP portable projection. Electronically Signed   By: Lauralyn Primes M.D.   On: 06/22/2019 20:59   Results/Tests Pending at Time of Discharge: none  Discharge Medications:  Allergies as of 06/24/2019      Reactions   Penicillins Rash   Amoxicillin Swelling   Food Hives, Itching, Rash   Mango       Medication List    STOP taking these medications   azithromycin 250 MG tablet Commonly known as: Zithromax Z-Pak   predniSONE 20 MG tablet Commonly known as: DELTASONE     TAKE these medications   ARIPiprazole 10 MG tablet Commonly known as: ABILIFY Take 1 tablet (10 mg total) by mouth daily. Start taking on: June 25, 2019   clindamycin 300 MG capsule Commonly known as: CLEOCIN Take 300 mg by mouth every 6 (six) hours.   guaiFENesin 600 MG 12 hr tablet Commonly known as: MUCINEX Take 600 mg by mouth 2 (two) times daily.   multivitamin with minerals Tabs tablet Take 1 tablet by mouth daily.       Discharge Instructions: Please refer to Patient Instructions section of EMR for full details.  Patient was counseled important signs and symptoms that should prompt return to medical care, changes in medications, dietary instructions, activity restrictions, and follow up appointments.   Follow-Up Appointments: Follow-up Information    [facility] Follow up on 07/17/2019.   Why: @ 2:30pm Contact information: [address] [place] [address] [phone]          Dana Allan, MD 06/24/2019, 4:10 PM PGY-1, [facility]  FPTS Upper-Level  Resident Addendum   I have independently interviewed and examined the patient. I have discussed the above with the original author and agree with their documentation. My edits for correction/addition/clarification are above. Please see also any attending notes.    Ellwood Dense, DO PGY-3, [facility] 06/24/2019 4:33 PM  FPTS Service pager: [phone] (text pages welcome through [facility])

## 2019-06-24 NOTE — Discharge Instructions (Signed)
Thank you for allowing [place] to take part in your care.   You were admitted to the hospital for symptoms consistent with a maniac episode due to Bipolar 1 Disorder.   You were evaluated by psychiatry who has prescribed Abilify 10mg  daily. You were referred to outpatient Psychiatry, you should receive a call about an appointment.  Follow up with new PCP on 07/17/19 at scheduled.

## 2019-06-24 NOTE — TOC Initial Note (Signed)
Transition of Care [facility]) - Initial/Assessment Note    Patient Details  Name: Steve Fuller MRN: [id number] Date of Birth: November 04, 1985  [facility] CM/SW Contact:    Gelene Mink, [place] Phone Number: 06/24/2019, 2:26 PM  Clinical Narrative:                  CSW met with the patient at bedside. The patient was alert and oriented. CSW introduced herself and explained her role. The patient stated that he and his significant other had moved down from [place] but he is originally from [place]. CSW provided him outpatient mental health resources. CSW made him an appointment at [facility] and [facility] for July 17, 2019 at 2:30pm.   CSW informed RNCM that the patient would need medication assistance.   CSW will continue to follow.   Expected Discharge Plan: Home/Self Care Barriers to Discharge: Continued Medical Work up   Patient Goals and CMS Choice Patient states their goals for this hospitalization and ongoing recovery are:: Pt did not state a goal      Expected Discharge Plan and Services Expected Discharge Plan: Home/Self Care In-house Referral: Clinical Social Work Discharge Planning Services: Follow-up appt scheduled, Medication Assistance, [place] [facility] Post Acute Care Choice: NA Living arrangements for the past 2 months: Apartment                 DME Arranged: N/A DME Agency: NA       HH Arranged: NA [place] Agency: NA        Prior Living Arrangements/Services Living arrangements for the past 2 months: Apartment Lives with:: Significant Other Patient language and need for interpreter reviewed:: No Do you feel safe going back to the place where you live?: Yes      Need for Family Participation in Patient Care: No (Comment) Care giver support system in place?: Yes (comment)   Criminal Activity/Legal Involvement Pertinent to Current Situation/Hospitalization: No - Comment as needed  Activities of Daily Living   ADL Screening  (condition at time of admission) Patient's cognitive ability adequate to safely complete daily activities?: Yes Is the patient deaf or have difficulty hearing?: No Does the patient have difficulty seeing, even when wearing glasses/contacts?: No Does the patient have difficulty concentrating, remembering, or making decisions?: No Patient able to express need for assistance with ADLs?: Yes Does the patient have difficulty dressing or bathing?: No Independently performs ADLs?: Yes (appropriate for developmental age) Does the patient have difficulty walking or climbing stairs?: No Weakness of Legs: None Weakness of Arms/Hands: None  Permission Sought/Granted Permission sought to share information with : Case Manager Permission granted to share information with : Yes, Verbal Permission Granted     Permission granted to share info w AGENCY: Outpatient mental health        Emotional Assessment Appearance:: Appears stated age Attitude/Demeanor/Rapport: Engaged Affect (typically observed): Calm Orientation: : Oriented to Self, Oriented to Place, Oriented to  Time, Oriented to Situation Alcohol / Substance Use: Not Applicable Psych Involvement: Outpatient Provider(Provided resources for outpatient mental health clinics)  Admission diagnosis:  Paranoia (psychosis) ([place]) [F22] Disorientation [R41.0] Patient Active Problem List   Diagnosis Date Noted  . Bipolar I disorder, current or most recent episode manic, severe ([place]) 06/23/2019  . Disorientation   . Paranoia (psychosis) ([place])   . Manic episode ([place]) 06/22/2019  . Migraine without status migrainosus, not intractable   . SARCOIDOSIS 12/10/2007  . MIGRAINE, CHRONIC 11/27/2007  . PULMONARY NODULE 11/27/2007  .  LYMPHADENOPATHY 11/27/2007   PCP:  Patient, No Pcp Per Pharmacy:   [facility] [place], [address] [place] [address] Phone:  [phone] Fax: [phone]  [facility] [place], [place] - [address] [place] [address] Phone: [phone] Fax: [phone]  [facility] [place], [place] [address] [place] [address] Phone: [phone] Fax: [phone]     Social Determinants of Health (SDOH) Interventions    Readmission Risk Interventions No flowsheet data found.

## 2019-06-24 NOTE — Plan of Care (Signed)
  Problem: Education: Goal: Knowledge of General Education information will improve Description: Including pain rating scale, medication(s)/side effects and non-pharmacologic comfort measures Outcome: Progressing   Problem: Coping: Goal: Level of anxiety will decrease Outcome: Progressing   

## 2019-06-24 NOTE — Progress Notes (Signed)
Nutrition Brief Note  Patient identified on the Malnutrition Screening Tool (MST) Report  Patient with PMH significant for sarcoidosis. Presents this admission with new onset episode of acute mania in previously undiagnosed bipolar disorder. Pt denies loss of appetite or unintentional weight loss. No skin breakdown recorded.   Wt Readings from Last 15 Encounters:  07/29/13 115.7 kg    Body mass index is 32.74 kg/m.   Current diet order is regular, patient is consuming approximately 80-100% of meals at this time. Labs and medications reviewed.   No nutrition interventions warranted at this time. If nutrition issues arise, please consult RD.   Mariana Single RD, LDN Clinical Nutrition Pager # [phone]

## 2019-06-24 NOTE — Plan of Care (Signed)
  Problem: Education: Goal: Knowledge of General Education information will improve Description: Including pain rating scale, medication(s)/side effects and non-pharmacologic comfort measures 06/24/2019 1548 by Dolores Hoose, RN Outcome: Adequate for Discharge 06/24/2019 1355 by Dolores Hoose, RN Outcome: Adequate for Discharge 06/24/2019 6720 by Dolores Hoose, RN Outcome: Progressing   Problem: Health Behavior/Discharge Planning: Goal: Ability to manage health-related needs will improve 06/24/2019 1548 by Dolores Hoose, RN Outcome: Adequate for Discharge 06/24/2019 1355 by Dolores Hoose, RN Outcome: Adequate for Discharge   Problem: Clinical Measurements: Goal: Ability to maintain clinical measurements within normal limits will improve 06/24/2019 1548 by Dolores Hoose, RN Outcome: Adequate for Discharge 06/24/2019 1355 by Dolores Hoose, RN Outcome: Adequate for Discharge   Problem: Clinical Measurements: Goal: Ability to maintain clinical measurements within normal limits will improve 06/24/2019 1548 by Dolores Hoose, RN Outcome: Adequate for Discharge 06/24/2019 1355 by Dolores Hoose, RN Outcome: Adequate for Discharge   Problem: Clinical Measurements: Goal: Ability to maintain clinical measurements within normal limits will improve 06/24/2019 1548 by Dolores Hoose, RN Outcome: Adequate for Discharge 06/24/2019 1355 by Dolores Hoose, RN Outcome: Adequate for Discharge Goal: Will remain free from infection 06/24/2019 1548 by Dolores Hoose, RN Outcome: Adequate for Discharge 06/24/2019 1355 by Dolores Hoose, RN Outcome: Adequate for Discharge Goal: Diagnostic test results will improve 06/24/2019 1548 by Dolores Hoose, RN Outcome: Adequate for Discharge 06/24/2019 1355 by Dolores Hoose, RN Outcome: Adequate for Discharge Goal: Respiratory complications will improve 06/24/2019 1548 by Dolores Hoose,  RN Outcome: Adequate for Discharge 06/24/2019 1355 by Dolores Hoose, RN Outcome: Adequate for Discharge Goal: Cardiovascular complication will be avoided 06/24/2019 1548 by Dolores Hoose, RN Outcome: Adequate for Discharge 06/24/2019 1355 by Dolores Hoose, RN Outcome: Adequate for Discharge

## 2019-07-17 ENCOUNTER — Ambulatory Visit: Payer: Self-pay | Attending: Family Medicine | Admitting: Family Medicine

## 2019-07-17 ENCOUNTER — Other Ambulatory Visit: Payer: Self-pay

## 2019-07-17 ENCOUNTER — Ambulatory Visit (HOSPITAL_BASED_OUTPATIENT_CLINIC_OR_DEPARTMENT_OTHER): Payer: Self-pay | Admitting: Licensed Clinical Social Worker

## 2019-07-17 DIAGNOSIS — F3113 Bipolar disorder, current episode manic without psychotic features, severe: Secondary | ICD-10-CM

## 2019-07-17 NOTE — BH Specialist Note (Signed)
Integrated Behavioral Health Visit via Telemedicine (Telephone)  07/17/2019 Corie Chiquito [id number]   Session Start time: 5:00 PM  Session End time: 5:30 PM Total time: 30  Referring Provider: Dr. Margarita Rana Type of Visit: Telephonic Patient location: Home [facility] Provider location: Office All persons participating in visit: LCSW and pt  Confirmed patient's address: No  Confirmed patient's phone number: Yes  Any changes to demographics: No   Confirmed patient's insurance: Yes  Any changes to patient's insurance: No   Discussed confidentiality: Yes    The following statements were read to the patient and/or legal guardian that are established with the [facility] Provider.  "The purpose of this phone visit is to provide behavioral health care while limiting exposure to the coronavirus (COVID19).  There is a possibility of technology failure and discussed alternative modes of communication if that failure occurs."  "By engaging in this telephone visit, you consent to the provision of healthcare.  Additionally, you authorize for your insurance to be billed for the services provided during this telephone visit."   Patient and/or legal guardian consented to telephone visit: Yes   PRESENTING CONCERNS: Patient and/or family reports the following symptoms/concerns: Pt reports difficulty managing mental health conditions stating that he had an episode with his father-in-law resulting in relocation to [facility]. He complains of difficulty sleeping and racing thoughts. Pt would like to be reunited with spouse of six years and two minor children.  Duration of problem: Ongoing; Severity of problem: severe  STRENGTHS (Protective Factors/Coping Skills): Pt has identified family as agents of motivation Pt receives support from family (mom, brothers, and sisters) Pt has been compliant with medication management Pt is open to therapy  GOALS ADDRESSED: Patient will: 1.  Reduce symptoms of:  agitation, anxiety and depression  2.  Increase knowledge and/or ability of: coping skills and healthy habits  3.  Demonstrate ability to: Increase healthy adjustment to current life circumstances and Increase adequate support systems for patient/family  INTERVENTIONS: Interventions utilized:  Solution-Focused Strategies, Supportive Counseling, Psychoeducation and/or Health Education and Link to [employer] Standardized Assessments completed: Not Needed  ASSESSMENT: Patient currently experiencing difficulty managing anxiety and depression symptoms. Reports difficulty sleeping, racing thoughts, and irritability triggered by financial strain and being separated from family. He denies any current substance use or suicidal or homicidal ideations.   Patient may benefit from medication management, therapy, and community resources to strengthen support system. LCSW provided validation and support. Strategies to assist in the management and/or decrease of symptoms were discussed. Pt is not interested in psychiatry; however, is willing to initiate therapy and is continuing medication regimen with Abilify. Crisis intervention resources were provided, in addition, to information on applying for medicaid in [place] and [employer] to assist with employment  PLAN: 1. Follow up with behavioral health clinician on : Contact LCSW with behavioral health or resource needs 2. Behavioral recommendations: Continue medication management, utilize healthy coping skills, and resources provided 3. Referral(s): [facility] (In Clinic), [facility] (LME/Outside Clinic) and Community Resources:  Finances  Rebekah [place]field, [place] 07/19/2019 3:27 PM

## 2019-07-17 NOTE — Progress Notes (Signed)
Virtual Visit via Telephone Note  I connected with Steve Fuller, on 07/17/2019 at 3:14 PM by telephone due to the COVID-19 pandemic and verified that I am speaking with the correct person using two identifiers.   Consent: I discussed the limitations, risks, security and privacy concerns of performing an evaluation and management service by telephone and the availability of in person appointments. I also discussed with the patient that there may be a patient responsible charge related to this service. The patient expressed understanding and agreed to proceed.   Location of Patient: Environmental education officer of Provider: Clinic   Persons participating in Telemedicine visit: Uchechukwu Dhawan [place]-CMA Dr. Margarita Rana     History of Present Illness: He is a 33 year old male who recently relocated from [place] recently and is here to establish care after hospitalization at [facility] from 06/22/2019 through 06/24/2019 for newly diagnosed Bipolar 1 disorder after he had presented with manic symptoms which required treatment with IV Haldol and IV Ativan for agitation.  Psychiatry consulted and Abilify commenced.  Without Abilify he sleeps about 4 hours. States he has a lot on his mind with a lot of things which accumulated over 5 years leading to his outburst and hsopitalization. Denies suicidal ideations; states "I am not crazy". 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 His wife and kids are in [place] with his in-laws and he is staying in a Hotel in [place] and will need assistance with medicaid and will also need assistance with relocating his family here and would like to be settled in [place]. He has been compliant with Abilify and does not have an appointment with Psychiatry.   Past Medical History:   Diagnosis Date  . Sarcoidosis    Allergies  Allergen Reactions  . Penicillins Rash  . Amoxicillin Swelling  . Food Hives, Itching and Rash    Mango      Current Outpatient Medications on File Prior to Visit  Medication Sig Dispense Refill  . ARIPiprazole (ABILIFY) 10 MG tablet Take 1 tablet (10 mg total) by mouth daily. 30 tablet 0  . clindamycin (CLEOCIN) 300 MG capsule Take 300 mg by mouth every 6 (six) hours.    Marland Kitchen guaiFENesin (MUCINEX) 600 MG 12 hr tablet Take 600 mg by mouth 2 (two) times daily.    . Multiple Vitamin (MULTIVITAMIN WITH MINERALS) TABS tablet Take 1 tablet by mouth daily.     No current facility-administered medications on file prior to visit.     Observations/Objective: Awake, alert, oriented x3 Not in acute distress  Assessment and Plan: 1. Bipolar I disorder, current or most recent episode manic, severe ([place]) Stable Continue Abilify Referred to LCSW to coordinate appointment with [facility] for follow-up with psychiatry and also provided resources given psychosocial needs.   Follow Up Instructions: Return for [facility] for referral to Psych and assist with psychosocial needs.    I discussed the assessment and treatment plan with the patient. The patient was provided an opportunity to ask questions and all were answered. The patient agreed with the plan and demonstrated an understanding of the instructions.   The patient was advised to call back or seek an in-person evaluation if the symptoms worsen or if the condition fails to improve as anticipated.     I provided 23 minutes total of non-face-to-face  time during this encounter including median intraservice time, reviewing previous notes, labs, imaging, medications, management and patient verbalized understanding.     Hoy Register, MD, FAAFP. [facility] [place], [place] [phone]   07/17/2019, 3:14 PM

## 2019-07-17 NOTE — Progress Notes (Signed)
Patient has been called and DOB has been verified. Patient has been screened and transferred to PCP to start phone visit.   Patient is having no pain, he states that medication makes him drowsy.

## 2019-07-18 ENCOUNTER — Encounter: Payer: Self-pay | Admitting: Family Medicine

## 2020-01-06 IMAGING — DX DG CHEST 1V PORT
2 series · 2 of 2 positions shown · non-contrast
Comparison: 01/09/2008

CLINICAL DATA: Overdose

EXAM:
PORTABLE CHEST 1 VIEW

[chest ap (1 of 2)]
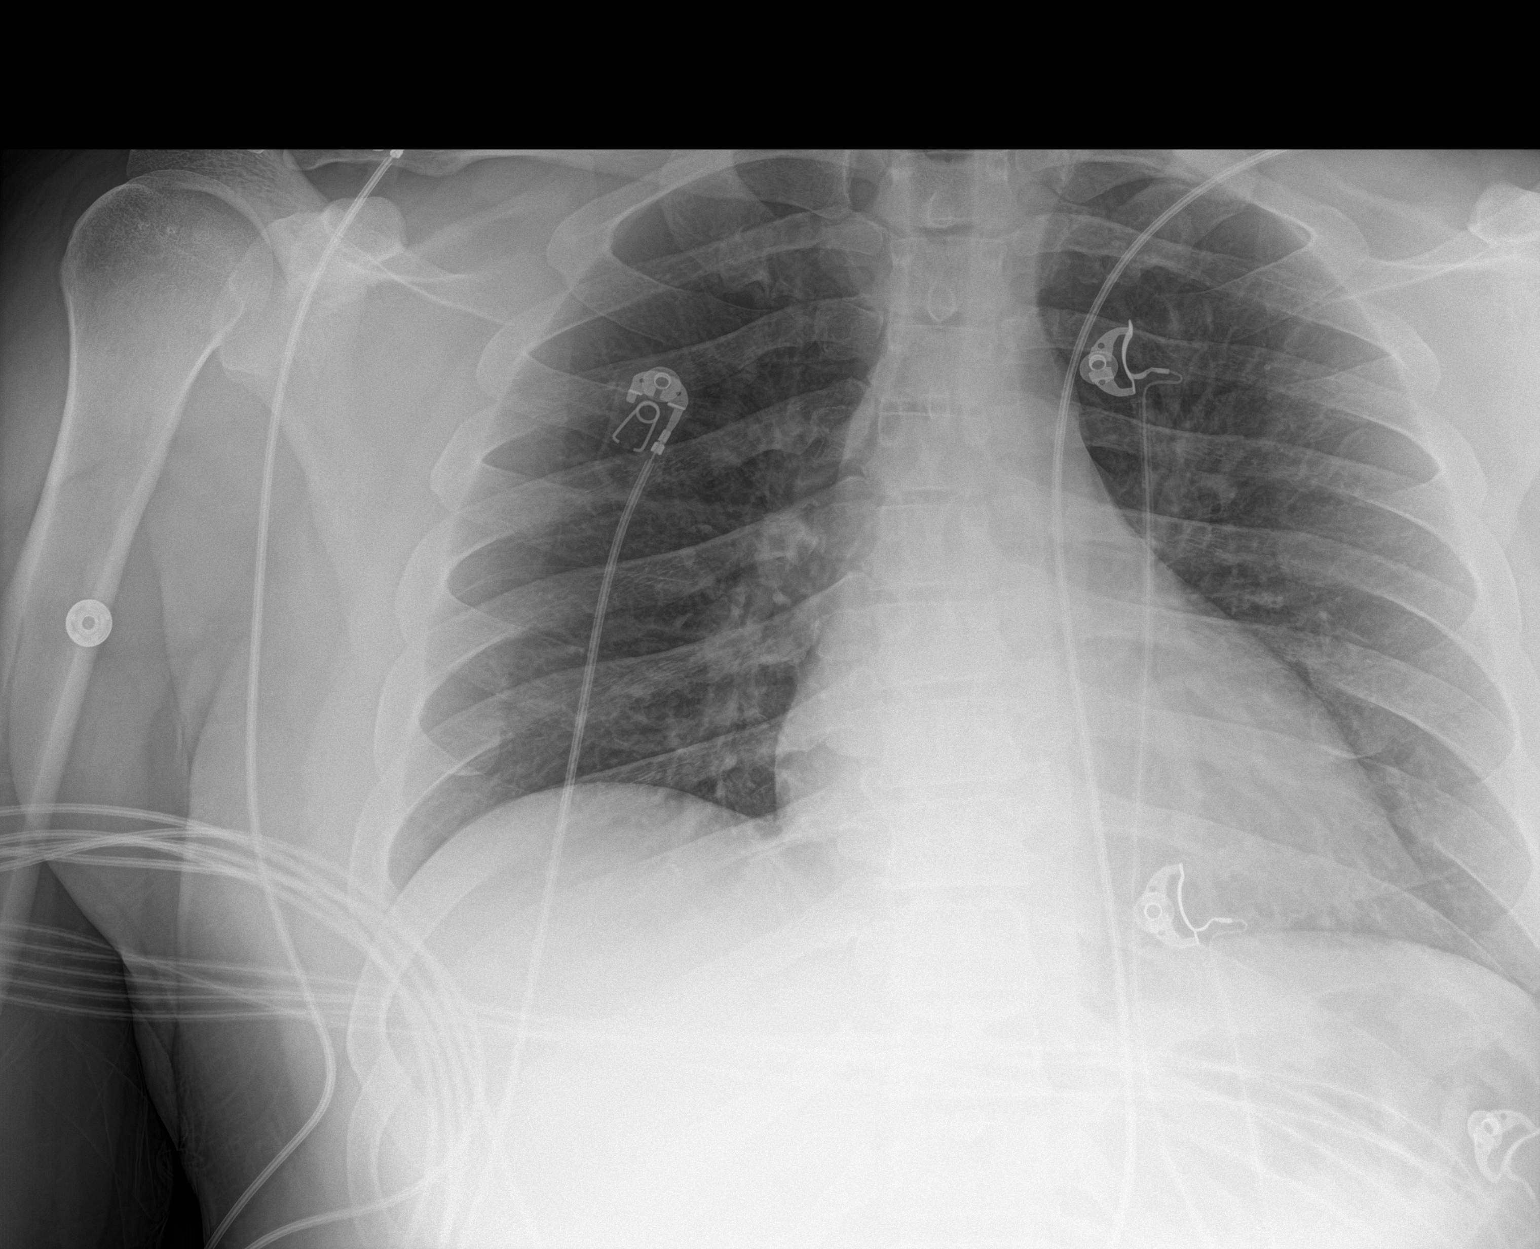

[chest ap (2 of 2)]
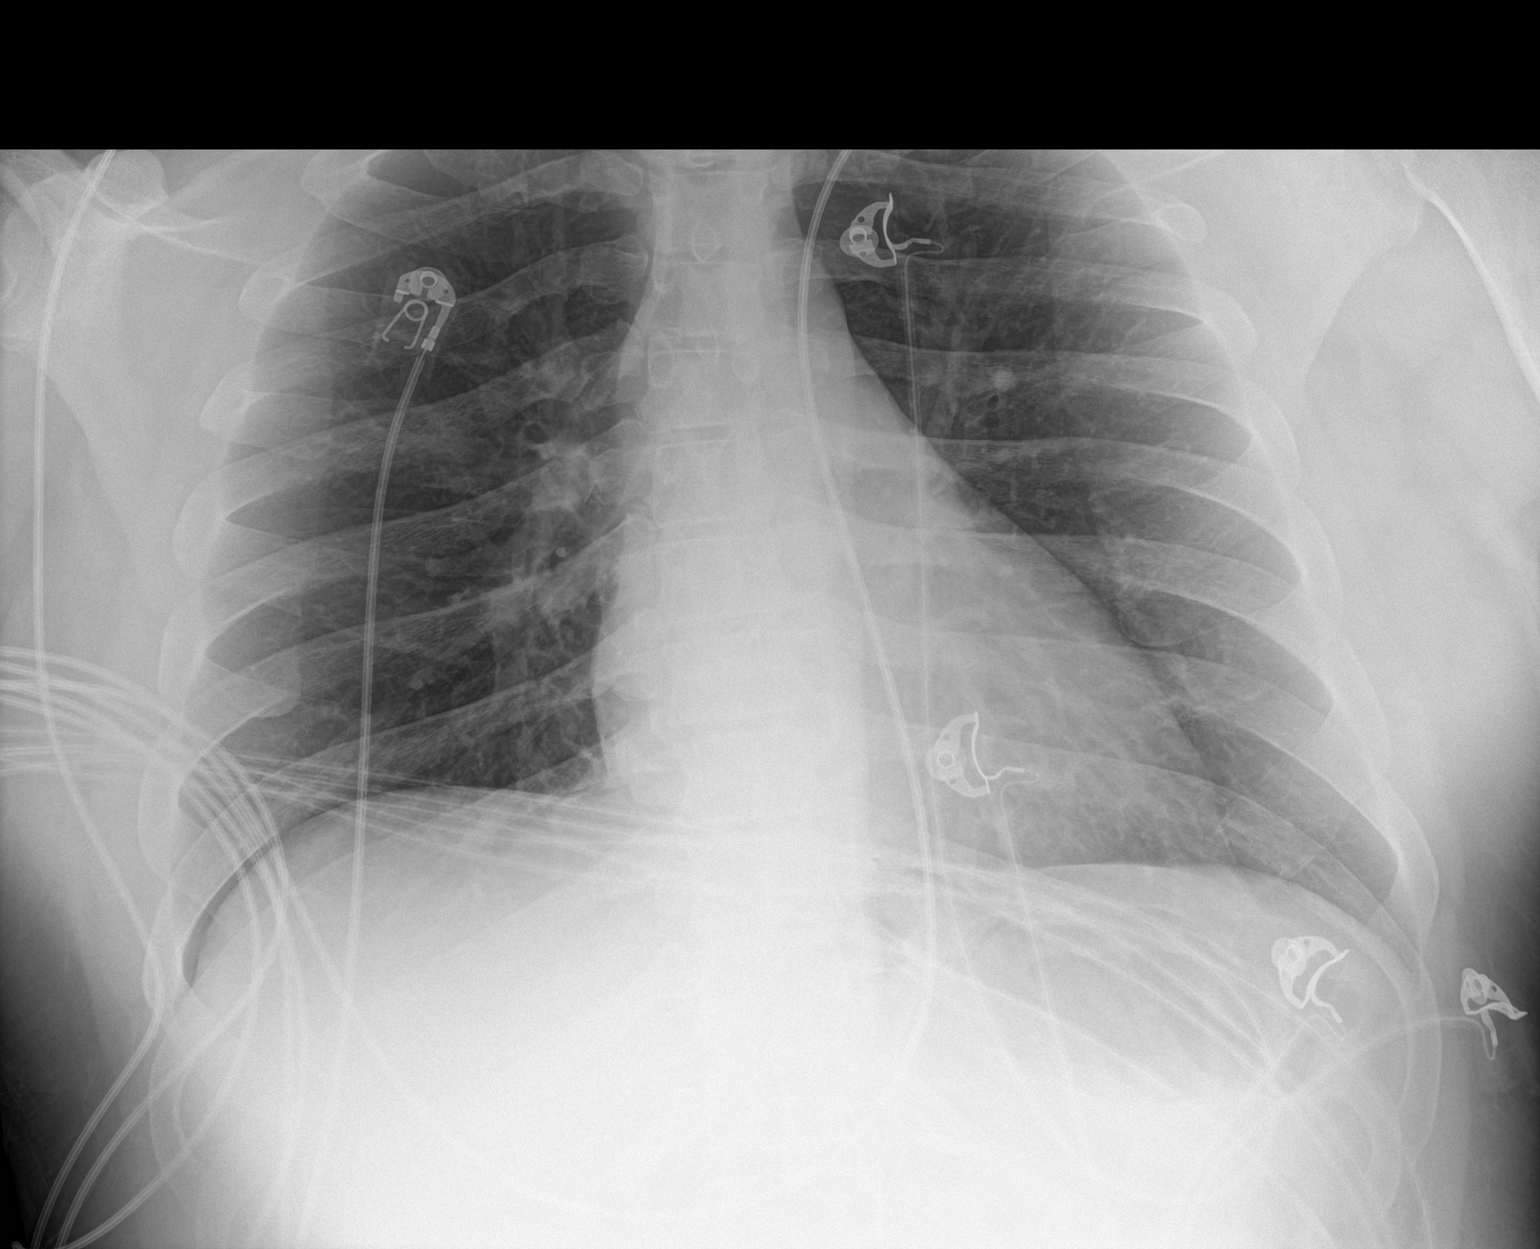

[2 of 2 positions shown; findings below may reference images not displayed]

FINDINGS: The heart size and mediastinal contours are within normal limits.
Both lungs are clear. The visualized skeletal structures are
unremarkable.
IMPRESSION: No acute abnormality of the lungs in AP portable projection.

## 2020-12-23 ENCOUNTER — Ambulatory Visit
Admission: RE | Admit: 2020-12-23 | Discharge: 2020-12-23 | Disposition: A | Discharge status: Home / Self Care | Payer: BC Managed Care – PPO | Source: Ambulatory Visit | Attending: Sports Medicine | Admitting: Sports Medicine

## 2020-12-23 ENCOUNTER — Other Ambulatory Visit: Payer: Self-pay

## 2020-12-23 VITALS — BP 157/106 | HR 92 | Temp 98.1°F | Resp 18 | Ht 74.0 in | Wt 330.0 lb

## 2020-12-23 DIAGNOSIS — R0789 Other chest pain: Secondary | ICD-10-CM

## 2020-12-23 DIAGNOSIS — R109 Unspecified abdominal pain: Secondary | ICD-10-CM

## 2020-12-23 DIAGNOSIS — M62838 Other muscle spasm: Secondary | ICD-10-CM

## 2020-12-23 DIAGNOSIS — S29011A Strain of muscle and tendon of front wall of thorax, initial encounter: Secondary | ICD-10-CM | POA: Diagnosis not present

## 2020-12-23 HISTORY — DX: Calculus of kidney: N20.0

## 2020-12-23 LAB — URINALYSIS, COMPLETE (UACMP) WITH MICROSCOPIC
Bilirubin Urine: NEGATIVE
Glucose, UA: NEGATIVE mg/dL
Hgb urine dipstick: NEGATIVE
Ketones, ur: NEGATIVE mg/dL
Leukocytes,Ua: NEGATIVE
Nitrite: NEGATIVE
Protein, ur: 30 mg/dL — AB
Specific Gravity, Urine: 1.025 (ref 1.005–1.030)
pH: 6 (ref 5.0–8.0)

## 2020-12-23 MED ORDER — IBUPROFEN 800 MG PO TABS: 800.0000 mg | ORAL_TABLET | Freq: Three times a day (TID) | ORAL | 0 refills | Status: AC

## 2020-12-23 MED ORDER — CYCLOBENZAPRINE HCL 10 MG PO TABS: 10.0000 mg | ORAL_TABLET | Freq: Two times a day (BID) | ORAL | 0 refills | Status: AC | PRN

## 2020-12-23 NOTE — Discharge Instructions (Signed)
Your urine does not show that you have a urinary tract infection.  There is no blood so I highly doubt you have a kidney stone.  Given where your symptoms are located it seems more like a musculoskeletal problem or a pulled muscle. I prescribed a muscle relaxer and ibuprofen. I also provided some educational handouts. If your symptoms persist please see your primary care provider.  If they worsen in any way please go to the ER.

## 2020-12-23 NOTE — ED Triage Notes (Signed)
Pt c/o left-sided flank pain for about a day. Pt does have history of kidney stones. Pt denies dysuria, hematuria, f/n/v/d or other symptoms.

## 2020-12-24 NOTE — ED Provider Notes (Signed)
[facility]    CSN: [id number] Arrival date & time: 12/23/20  1206      History   Chief Complaint Chief Complaint  Patient presents with  . Flank Pain    left    HPI Steve Fuller is a 35 y.o. male.   Patient is a pleasant 35 year old male who presents for evaluation of 2 days of left-sided flank and rib pain mid axillary line from the axilla on the left side down to the eighth or ninth rib area.  He does not recall any specific accident trauma or falls.  He was in [place] recently and he was doing a lot of lifting especially of luggage and suitcases.  Although he does not recall an injury it is possible that he may have tweaked the muscles just under his left axilla.  Normally sees [facility] in [place] for his ongoing medical care.  They were unavailable to see him today.  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  He denies any lumps or bumps.  No fever shakes chills.  No nausea vomiting diarrhea.  No rashes.  No urinary or abdominal symptoms.  No chest pain or shortness of breath.  No history of blood clots.  He denies any pain or swelling in the upper or lower extremities.  He is concerned given that his discomfort does extend around to his left flank that he may have some underlying urinary symptoms and would like to be checked for that.  That said he denies any dysuria, hematuria, increased urinary frequency or urgency.  No chest pain or shortness of breath.  No red flag signs or symptoms elicited on history.     Past Medical History:  Diagnosis Date  . Kidney stones   . Sarcoidosis     Patient Active Problem List   Diagnosis Date Noted  . Bipolar I disorder, current or most recent episode manic, severe (HCC) 06/23/2019  . Disorientation   . Paranoia (psychosis) (HCC)   . Manic episode (HCC) 06/22/2019  . Migraine without status migrainosus, not intractable   . SARCOIDOSIS 12/10/2007  . MIGRAINE, CHRONIC 11/27/2007  . PULMONARY NODULE  11/27/2007  . LYMPHADENOPATHY 11/27/2007    History reviewed. No pertinent surgical history.     Home Medications    Prior to Admission medications   Medication Sig Start Date End Date Taking? Authorizing Provider  cyclobenzaprine (FLEXERIL) 10 MG tablet Take 1 tablet (10 mg total) by mouth 2 (two) times daily as needed for muscle spasms. 12/23/20  Yes Delton See, MD  ibuprofen (ADVIL) 800 MG tablet Take 1 tablet (800 mg total) by mouth 3 (three) times daily. 12/23/20  Yes Delton See, MD  Multiple Vitamin (MULTIVITAMIN WITH MINERALS) TABS tablet Take 1 tablet by mouth daily.   Yes [provider]  Vitamin D, Ergocalciferol, (DRISDOL) 1.25 MG (50000 UNIT) CAPS capsule Take 1 capsule by mouth once a week. 12/07/20  Yes [provider]  ARIPiprazole (ABILIFY) 10 MG tablet Take 1 tablet (10 mg total) by mouth daily. 06/25/19   Dana Allan, MD  clindamycin (CLEOCIN) 300 MG capsule Take 300 mg by mouth every 6 (six) hours. 06/12/19   [provider]  guaiFENesin (MUCINEX) 600 MG 12 hr tablet Take 600 mg by mouth 2 (two) times daily.    [provider]    Family History No family history on file.  Social History Social History   Tobacco Use  . Smoking status: Light Tobacco Smoker  Packs/day: 0.50    Types: Cigarettes  . Smokeless tobacco: Never Used  Vaping Use  . Vaping Use: Never used  Substance Use Topics  . Alcohol use: No  . Drug use: No     Allergies   Penicillins, Amoxicillin, and Food   Review of Systems Review of Systems  Constitutional: Positive for activity change. Negative for appetite change, chills, diaphoresis, fatigue and fever.  HENT: Negative.  Negative for congestion, ear pain, sinus pressure, sinus pain and sore throat.   Eyes: Negative.  Negative for pain.  Respiratory: Negative.  Negative for cough, choking, chest tightness, shortness of breath, wheezing and stridor.   Cardiovascular: Negative.  Negative  for chest pain and palpitations.  Gastrointestinal: Negative.  Negative for abdominal pain, blood in stool, constipation, diarrhea, nausea and vomiting.  Genitourinary: Positive for flank pain. Negative for dysuria, frequency, hematuria and urgency.  Musculoskeletal: Positive for myalgias. Negative for arthralgias, back pain, neck pain and neck stiffness.  Skin: Negative.  Negative for color change, pallor, rash and wound.  Neurological: Negative for dizziness, syncope, light-headedness, numbness and headaches.  Hematological: Negative.  Negative for adenopathy. Does not bruise/bleed easily.  All other systems reviewed and are negative.    Physical Exam Triage Vital Signs ED Triage Vitals  Enc Vitals Group     BP 12/23/20 1220 (!) 157/106     Pulse Rate 12/23/20 1220 92     Resp 12/23/20 1220 18     Temp 12/23/20 1220 98.1 F (36.7 C)     Temp Source 12/23/20 1220 Oral     SpO2 12/23/20 1220 99 %     Weight 12/23/20 1218 (!) 330 lb (149.7 kg)     Height 12/23/20 1218 6\' 2"  (1.88 m)     Head Circumference --      Peak Flow --      Pain Score 12/23/20 1218 7     Pain Loc --      Pain Edu? --      Excl. in GC? --    No data found.  Updated Vital Signs BP (!) 157/106 (BP Location: Left Arm)   Pulse 92   Temp 98.1 F (36.7 C) (Oral)   Resp 18   Ht 6\' 2"  (1.88 m)   Wt (!) 149.7 kg   SpO2 99%   BMI 42.37 kg/m   Visual Acuity Right Eye Distance:   Left Eye Distance:   Bilateral Distance:    Right Eye Near:   Left Eye Near:    Bilateral Near:     Physical Exam Vitals and nursing note reviewed.  Constitutional:      General: He is not in acute distress.    Appearance: Normal appearance. He is not ill-appearing, toxic-appearing or diaphoretic.  HENT:     Head: Normocephalic and atraumatic.  Eyes:     General: No scleral icterus.       Right eye: No discharge.        Left eye: No discharge.     Extraocular Movements: Extraocular movements intact.      Conjunctiva/sclera: Conjunctivae normal.     Pupils: Pupils are equal, round, and reactive to light.  Cardiovascular:     Rate and Rhythm: Normal rate and regular rhythm.     Pulses: Normal pulses.     Heart sounds: Normal heart sounds. No murmur heard. No friction rub. No gallop.   Pulmonary:     Effort: Pulmonary effort is normal. No respiratory distress.  Breath sounds: Normal breath sounds. No stridor. No wheezing, rhonchi or rales.     Comments: He is tender to palpation over the left-sided rib cage mid axillary line from about the fourth to the above the eighth rib.  No lymphadenopathy.  Some mild muscle spasm noted just inferior to the left axilla. Chest:     Chest wall: Tenderness present.  Abdominal:     Tenderness: There is no abdominal tenderness. There is no right CVA tenderness, left CVA tenderness, guarding or rebound.  Musculoskeletal:        General: No signs of injury.     Cervical back: Normal range of motion and neck supple. No rigidity or tenderness.     Right lower leg: Edema present.     Left lower leg: No edema.  Lymphadenopathy:     Cervical: No cervical adenopathy.  Skin:    General: Skin is warm.     Capillary Refill: Capillary refill takes less than 2 seconds.     Findings: No bruising, erythema, lesion or rash.  Neurological:     General: No focal deficit present.     Mental Status: He is alert and oriented to person, place, and time.      UC Treatments / Results  Labs (all labs ordered are listed, but only abnormal results are displayed) Labs Reviewed  URINALYSIS, COMPLETE (UACMP) WITH MICROSCOPIC - Abnormal; Notable for the following components:      Result Value   Protein, ur 30 (*)    Bacteria, UA FEW (*)    All other components within normal limits    EKG   Radiology No results found.  Procedures Procedures (including critical care time)  Medications Ordered in UC Medications - No data to display  Initial Impression /  Assessment and Plan / UC Course  I have reviewed the triage vital signs and the nursing notes.  Pertinent labs & imaging results that were available during my care of the patient were reviewed by me and considered in my medical decision making (see chart for details).  Clinical impression: Left-sided mid axillary rib pain extending to the left flank.  There is some associated mild muscle spasm.  Cannot fully rule out a renal issue specifically renal calculi.  That said his exam seems most consistent with a musculoskeletal issue versus potentially costochondritis.  He does have muscle spasm on examination.  Treatment plan: 1.  The findings and treatment plan were discussed in detail with the patient.  Patient was in agreement. 2.  Given his request we will go ahead and get a UA.  It did show few bacteria which is probably contaminant.  No hematuria.  No evidence of a UTI or renal calculi.  3.  His exam is consistent with left-sided chest wall pain and muscle spasm.  It is also consistent with a muscle strain.  We will treat accordingly. 4.  Prescribed 800 of ibuprofen as well as Flexeril.  It was sent to his pharmacy. 5.  Educational handouts provided. 6.  Over-the-counter meds as needed, activity modification, icing. 7.  I offered a work note he declined. 8.  If his symptoms persist I have asked him to see his primary care provider.  If they worsen in any way he needs to go to the ER.  Red flag signs and symptoms were discussed in detail which included but were not limited to worsening chest pain and shortness of breath.  He knows to call 911 and/or go to the ER.  9.  He was stable upon discharge.  He will follow-up with [place] as needed.    Final Clinical Impressions(s) / UC Diagnoses   Final diagnoses:  Left-sided chest wall pain  Muscle spasm  Muscle strain of chest wall, initial encounter  Left flank pain     Discharge Instructions     Your urine does not show that you have a urinary  tract infection.  There is no blood so I highly doubt you have a kidney stone.  Given where your symptoms are located it seems more like a musculoskeletal problem or a pulled muscle. I prescribed a muscle relaxer and ibuprofen. I also provided some educational handouts. If your symptoms persist please see your primary care provider.  If they worsen in any way please go to the ER.    ED Prescriptions    Medication Sig Dispense Auth. Provider   ibuprofen (ADVIL) 800 MG tablet Take 1 tablet (800 mg total) by mouth 3 (three) times daily. 21 tablet Delton See, MD   cyclobenzaprine (FLEXERIL) 10 MG tablet Take 1 tablet (10 mg total) by mouth 2 (two) times daily as needed for muscle spasms. 20 tablet Delton See, MD     PDMP not reviewed this encounter.   Delton See, MD 12/24/20 1530

## 2021-06-06 ENCOUNTER — Other Ambulatory Visit: Payer: Self-pay

## 2024-07-29 ENCOUNTER — Ambulatory Visit (HOSPITAL_COMMUNITY)
Admission: EM | Admit: 2024-07-29 | Discharge: 2024-07-29 | Disposition: A | Discharge status: Home / Self Care | Payer: Self-pay

## 2024-07-29 DIAGNOSIS — Z008 Encounter for other general examination: Secondary | ICD-10-CM

## 2024-07-29 NOTE — ED Provider Notes (Signed)
 [facility]al Health Urgent Care Medical Screening Exam  Patient Name: Steve Fuller MRN: [id number] Date of Evaluation: 07/29/24 Chief Complaint:   Diagnosis:  Final diagnoses:  Encounter for psychological evaluation    History of Present illness: Steve Fuller is a 38 y.o. male.  Admitted history of bipolar disorder 1.  Presented to [facility] voluntarily.  According to the patient he was told by at the court to come here and get just a paper to say that he was here.  Writer discussed with patient that he did not understand what he was asking for and patient went on to say that he is in a custody battle for his kids.  According to patient he was told just to come here and all he needs is the paper to show that he was here.  Patient is alert and oriented x 4, speech is clear, maintain eye contact.  Patient denies SI, HI, AVH or paranoia.  It is uncertain as to what patient is looking for on this visit.  Writer did talk to Dr. Cole MD, about the patient's request.  Dr. Cole did suggest to give the patient this AVS because there is nothing else we can do as far as it is related to her court documents.  He also suggest that patient need to follow-up with his provider.  Writer also discussed with patient that if he needs an evaluation he would have to return for the walk in clinic.  Patient stated all he needs is just a piece of paper to show that he was here tonight he does not need anything else.     Flowsheet Row ED from 07/29/2024 in [facility] UC from 12/23/2020 in [facility]   C-SSRS RISK CATEGORY No Risk No Risk    Psychiatric Specialty Exam  Presentation  General Appearance:Casual  Eye Contact:Good  Speech:Clear and Coherent  Speech Volume:Normal  Handedness:Right   Mood and Affect  Mood: Anxious  Affect: Appropriate   Thought Process  Thought Processes: Coherent  Descriptions of  Associations:Intact  Orientation:Full (Time, Place and Person)  Thought Content:WDL    Hallucinations:None  Ideas of Reference:None  Suicidal Thoughts:No  Homicidal Thoughts:No   Sensorium  Memory: Immediate Fair  Judgment: Fair  Insight: Fair   Art Therapist  Concentration: Fair  Attention Span: Fair  Recall: [employer] of Knowledge: Fair  Language: Fair   Psychomotor Activity  Psychomotor Activity: Normal   Assets  Assets: Desire for Improvement   Sleep  Sleep: Fair  Number of hours:  6   Physical Exam: Physical Exam HENT:     Head: Normocephalic.     Nose: Nose normal.  Eyes:     Pupils: Pupils are equal, round, and reactive to light.  Cardiovascular:     Rate and Rhythm: Normal rate.  Pulmonary:     Effort: Pulmonary effort is normal.  Musculoskeletal:        General: Normal range of motion.     Cervical back: Normal range of motion.  Skin:    General: Skin is warm.  Neurological:     General: No focal deficit present.     Mental Status: He is alert.  Psychiatric:        Mood and Affect: Mood normal.    Review of Systems  Constitutional: Negative.   HENT: Negative.    Eyes: Negative.   Respiratory: Negative.    Cardiovascular: Negative.   Gastrointestinal: Negative.   Genitourinary: Negative.  Musculoskeletal: Negative.   Skin: Negative.   Neurological: Negative.   Psychiatric/Behavioral:  The patient is nervous/anxious.    Blood pressure 137/79, pulse 89, temperature 98.6 F (37 C), temperature source Oral, resp. rate 19, SpO2 99%. There is no height or weight on file to calculate BMI.  Musculoskeletal: Strength & Muscle Tone: within normal limits Gait & Station: normal Patient leans: N/A   [facility] MSE Discharge Disposition for Follow up and Recommendations: Based on my evaluation the patient does not appear to have an emergency medical condition and can be discharged with resources and follow up care  in outpatient services for Individual Therapy   Gaither Pouch, NP 07/29/2024, 10:15 PM

## 2024-07-29 NOTE — Discharge Instructions (Addendum)
 Patient present to [facility], with a court order document.  However patient did not want to show what was in the document but requiring us  to give him a paper that he was here.  Writer discussed with patient that he needs to follow-up with his PCP or his provider.  Do an evaluation.  However patient stated he only needs his arm behind and a paper to show that he was here.

## 2024-07-29 NOTE — Progress Notes (Signed)
   07/29/24 1854  [facility] Triage Screening (Walk-ins at [facility] only)  How Did You Hear About Us ? Self  What Is the Reason for Your Visit/Call Today? Steve Fuller is a 38 year old male here seeking a mental health evaluation for a custody agreement regarding his children. Pt reports he is needing to upload an evaluation for his records that he is clear and coherent. Pt denies substance use, Si, Hi and AVH.  How Long Has This Been Causing You Problems? <Week  Have You Recently Had Any Thoughts About Hurting Yourself? No  Are You Planning to Commit Suicide/Harm Yourself At This time? No  Have you Recently Had Thoughts About Hurting Someone Sherral? No  Are You Planning To Harm Someone At This Time? No  Physical Abuse Denies  Verbal Abuse Denies  Sexual Abuse Denies  Exploitation of patient/patient's resources Denies  Self-Neglect Denies  Possible abuse reported to: Other (Comment)  Are you currently experiencing any auditory, visual or other hallucinations? No  Have You Used Any Alcohol or Drugs in the Past 24 Hours? No  Do you have any current medical co-morbidities that require immediate attention? No  What Do You Feel Would Help You the Most Today? Social Support  If access to [facility] was not available, would you have sought care in the Emergency Department? No  Determination of Need Routine (7 days)  Options For Referral Other: Comment
